# Patient Record
Sex: Female | Born: 1950 | Race: White | Hispanic: No | Smoking: Current every day smoker
Health system: Southern US, Community
[De-identification: ages and names within clinical notes are randomized; demographics above are authoritative.]

## PROBLEM LIST (undated history)

## (undated) DIAGNOSIS — J45909 Unspecified asthma, uncomplicated: Secondary | ICD-10-CM

## (undated) DIAGNOSIS — IMO0001 Reserved for inherently not codable concepts without codable children: Secondary | ICD-10-CM

## (undated) DIAGNOSIS — H9190 Unspecified hearing loss, unspecified ear: Secondary | ICD-10-CM

## (undated) DIAGNOSIS — J40 Bronchitis, not specified as acute or chronic: Secondary | ICD-10-CM

## (undated) HISTORY — DX: Reserved for inherently not codable concepts without codable children: IMO0001

## (undated) HISTORY — PX: CARTILAGE SURGERY: SHX1303

## (undated) HISTORY — DX: Unspecified asthma, uncomplicated: J45.909

## (undated) HISTORY — DX: Bronchitis, not specified as acute or chronic: J40

## (undated) HISTORY — PX: INNER EAR SURGERY: SHX679

## (undated) HISTORY — PX: APPENDECTOMY: SHX54

## (undated) HISTORY — PX: TUBAL LIGATION: SHX77

## (undated) HISTORY — DX: Unspecified hearing loss, unspecified ear: H91.90

---

## 2001-10-04 DIAGNOSIS — N200 Calculus of kidney: Secondary | ICD-10-CM | POA: Insufficient documentation

## 2005-02-09 ENCOUNTER — Ambulatory Visit: Payer: Self-pay | Admitting: Family Medicine

## 2006-01-13 ENCOUNTER — Ambulatory Visit: Payer: Self-pay | Admitting: Family Medicine

## 2006-01-27 ENCOUNTER — Ambulatory Visit: Payer: Self-pay | Admitting: Gastroenterology

## 2006-02-04 ENCOUNTER — Ambulatory Visit (HOSPITAL_COMMUNITY): Admission: RE | Admit: 2006-02-04 | Discharge: 2006-02-04 | Payer: Self-pay | Admitting: Gastroenterology

## 2006-02-04 DIAGNOSIS — K222 Esophageal obstruction: Secondary | ICD-10-CM | POA: Insufficient documentation

## 2006-02-04 DIAGNOSIS — K449 Diaphragmatic hernia without obstruction or gangrene: Secondary | ICD-10-CM | POA: Insufficient documentation

## 2006-02-11 ENCOUNTER — Ambulatory Visit: Payer: Self-pay | Admitting: Gastroenterology

## 2006-11-03 ENCOUNTER — Ambulatory Visit: Payer: Self-pay | Admitting: Family Medicine

## 2008-06-25 ENCOUNTER — Ambulatory Visit: Payer: Self-pay | Admitting: Family Medicine

## 2008-06-25 DIAGNOSIS — H6120 Impacted cerumen, unspecified ear: Secondary | ICD-10-CM | POA: Insufficient documentation

## 2008-06-28 DIAGNOSIS — H9190 Unspecified hearing loss, unspecified ear: Secondary | ICD-10-CM | POA: Insufficient documentation

## 2008-07-19 ENCOUNTER — Telehealth (INDEPENDENT_AMBULATORY_CARE_PROVIDER_SITE_OTHER): Payer: Self-pay | Admitting: Internal Medicine

## 2008-11-15 ENCOUNTER — Emergency Department (HOSPITAL_COMMUNITY): Admission: EM | Admit: 2008-11-15 | Discharge: 2008-11-15 | Payer: Self-pay | Admitting: Emergency Medicine

## 2011-04-11 ENCOUNTER — Emergency Department: Payer: Self-pay | Admitting: Emergency Medicine

## 2014-02-26 ENCOUNTER — Emergency Department: Payer: Self-pay | Admitting: Emergency Medicine

## 2014-02-26 LAB — COMPREHENSIVE METABOLIC PANEL
ALBUMIN: 4.3 g/dL (ref 3.4–5.0)
Alkaline Phosphatase: 68 U/L
Anion Gap: 7 (ref 7–16)
BUN: 26 mg/dL — AB (ref 7–18)
Bilirubin,Total: 0.2 mg/dL (ref 0.2–1.0)
CHLORIDE: 111 mmol/L — AB (ref 98–107)
CREATININE: 0.56 mg/dL — AB (ref 0.60–1.30)
Calcium, Total: 9.4 mg/dL (ref 8.5–10.1)
Co2: 22 mmol/L (ref 21–32)
EGFR (Non-African Amer.): >60
GLUCOSE: 86 mg/dL (ref 65–99)
OSMOLALITY: 283 (ref 275–301)
Potassium: 3.8 mmol/L (ref 3.5–5.1)
SGOT(AST): 28 U/L (ref 15–37)
SGPT (ALT): 13 U/L (ref 12–78)
Sodium: 140 mmol/L (ref 136–145)
Total Protein: 8.3 g/dL — ABNORMAL HIGH (ref 6.4–8.2)

## 2014-02-26 LAB — URINALYSIS, COMPLETE
Bilirubin,UR: NEGATIVE
Blood: NEGATIVE
GLUCOSE, UR: NEGATIVE mg/dL (ref 0–75)
Nitrite: NEGATIVE
Ph: 5 (ref 4.5–8.0)
Protein: 30
RBC,UR: 10 /HPF (ref 0–5)
Specific Gravity: 1.032 (ref 1.003–1.030)

## 2014-02-26 LAB — CBC WITH DIFFERENTIAL/PLATELET
BASOS ABS: 0.1 10*3/uL (ref 0.0–0.1)
Basophil %: 1.1 %
Eosinophil #: 0 10*3/uL (ref 0.0–0.7)
Eosinophil %: 0.3 %
HCT: 43.5 % (ref 35.0–47.0)
HGB: 14.3 g/dL (ref 12.0–16.0)
LYMPHS ABS: 0.9 10*3/uL — AB (ref 1.0–3.6)
Lymphocyte %: 15.6 %
MCH: 33 pg (ref 26.0–34.0)
MCHC: 32.9 g/dL (ref 32.0–36.0)
MCV: 100 fL (ref 80–100)
MONO ABS: 0.4 x10 3/mm (ref 0.2–0.9)
Monocyte %: 7 %
NEUTROS ABS: 4.5 10*3/uL (ref 1.4–6.5)
NEUTROS PCT: 76 %
Platelet: 414 10*3/uL (ref 150–440)
RBC: 4.34 10*6/uL (ref 3.80–5.20)
RDW: 13.8 % (ref 11.5–14.5)
WBC: 6 10*3/uL (ref 3.6–11.0)

## 2015-10-09 IMAGING — CR NECK SOFT TISSUES - 1+ VIEW
1 series · 2 of 2 positions shown · non-contrast
Comparison: None.

CLINICAL DATA: Vomiting, sensation of foreign body

EXAM:
NECK SOFT TISSUES - 1+ VIEW

[Series 1: w soft tissue neck lat · 0.14mm/px · 2 of 2 slices shown]
[im 1/2]
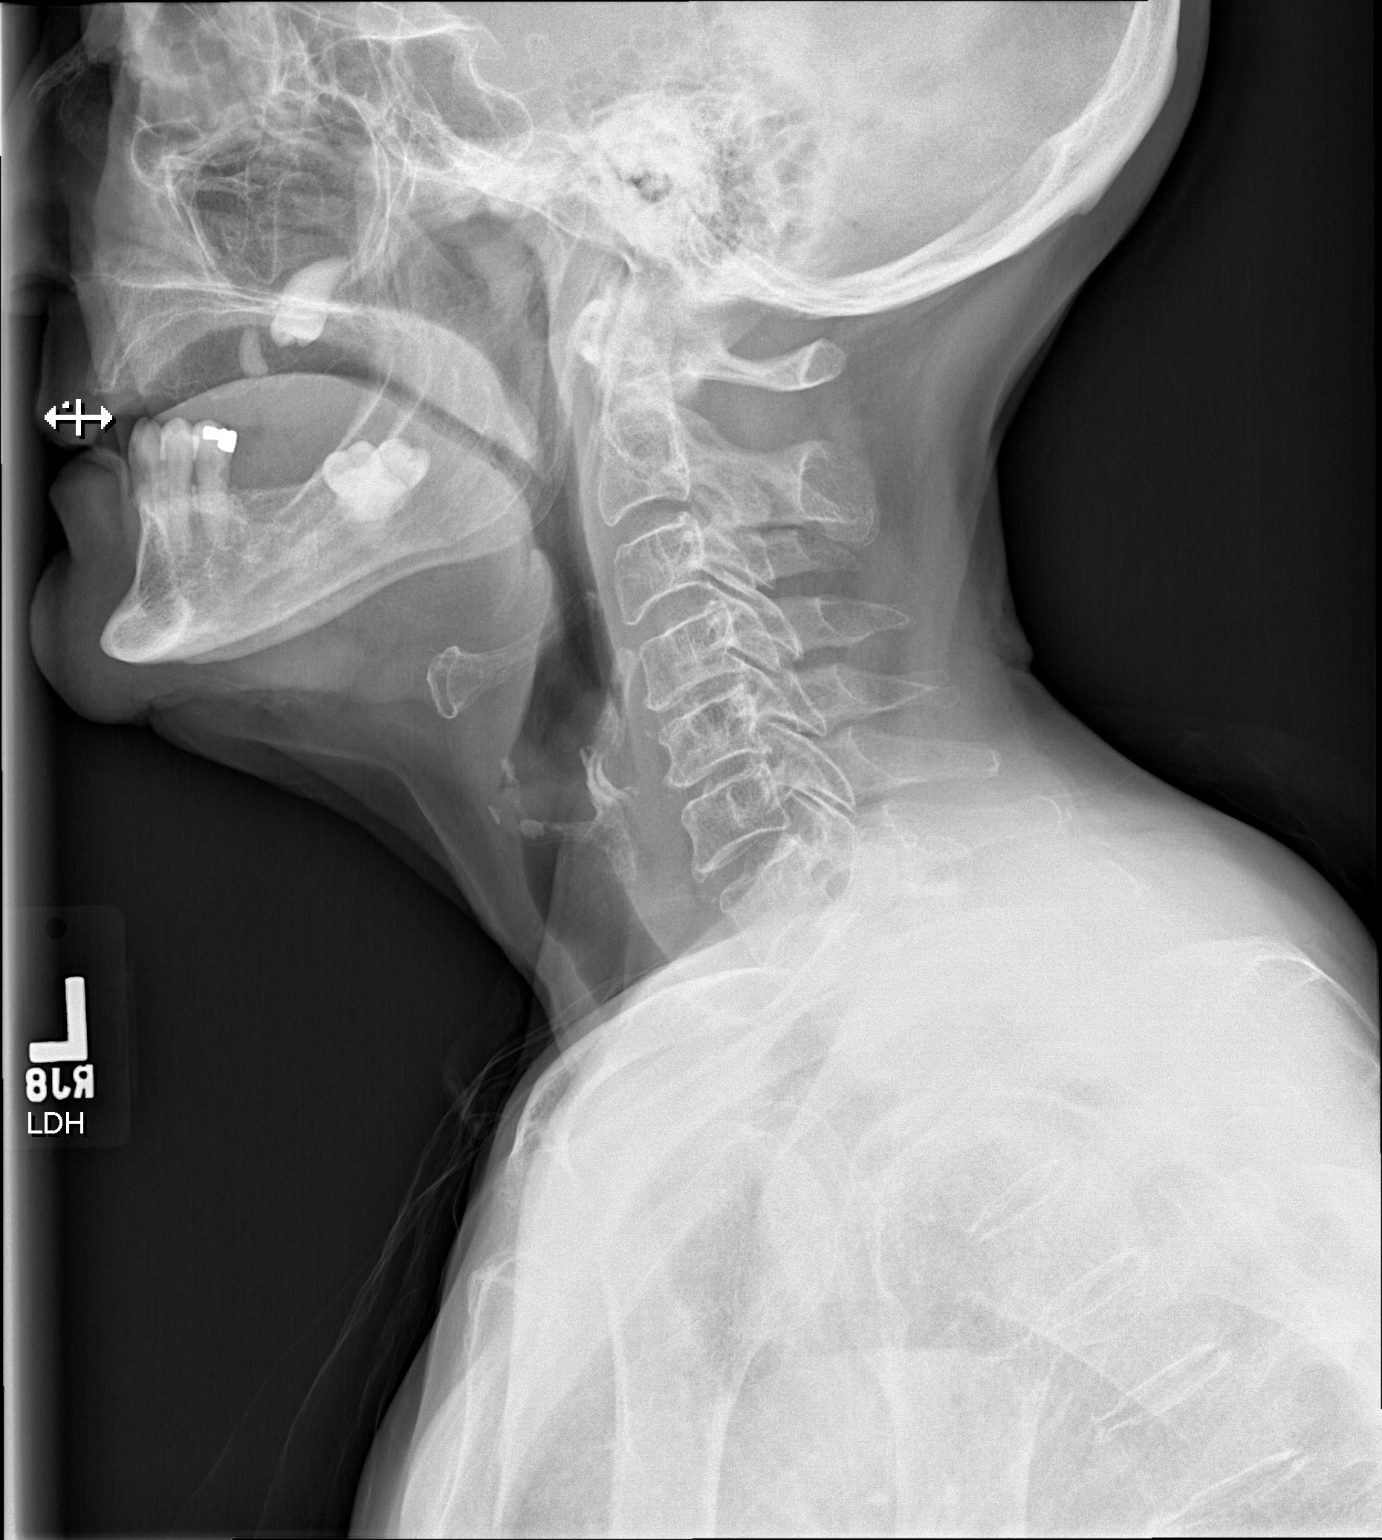
[im 2/2]
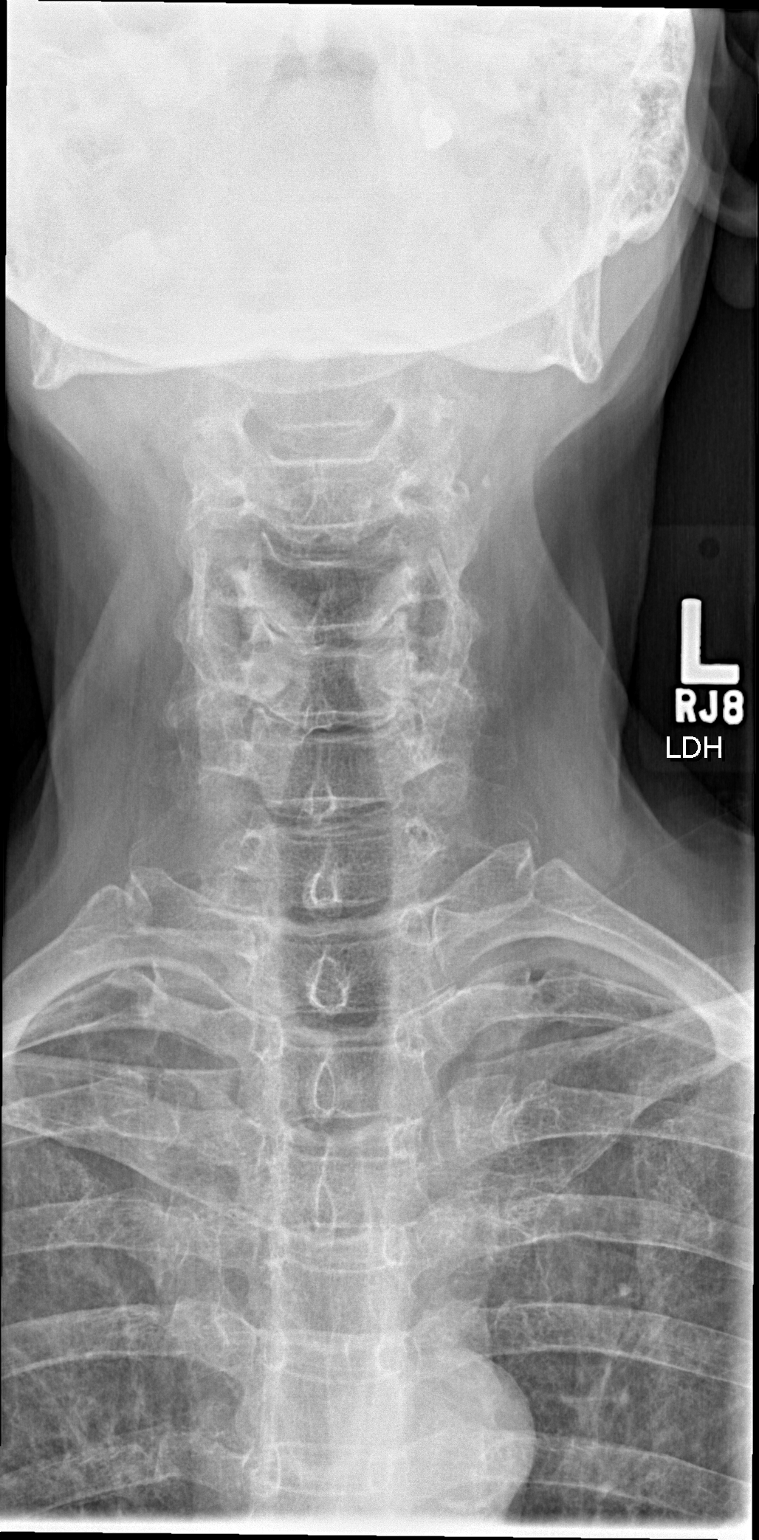

[2 of 2 positions shown; findings below may reference images not displayed]

FINDINGS: Mild degenerative changes of cervical spine are noted. No radiopaque
foreign body is seen. No gross soft tissue abnormality is noted.
IMPRESSION: No acute abnormality noted.

## 2022-04-28 ENCOUNTER — Ambulatory Visit: Payer: Self-pay | Admitting: Family

## 2022-05-05 ENCOUNTER — Encounter: Payer: Self-pay | Admitting: Family

## 2022-05-05 ENCOUNTER — Telehealth: Payer: Self-pay | Admitting: Family

## 2022-05-05 ENCOUNTER — Other Ambulatory Visit: Payer: Self-pay | Admitting: Family

## 2022-05-05 ENCOUNTER — Ambulatory Visit (INDEPENDENT_AMBULATORY_CARE_PROVIDER_SITE_OTHER): Payer: Self-pay | Admitting: Family

## 2022-05-05 ENCOUNTER — Ambulatory Visit (INDEPENDENT_AMBULATORY_CARE_PROVIDER_SITE_OTHER)
Admission: RE | Admit: 2022-05-05 | Discharge: 2022-05-05 | Disposition: A | Discharge status: Home / Self Care | Payer: Self-pay | Source: Ambulatory Visit | Attending: Family | Admitting: Family

## 2022-05-05 VITALS — BP 130/90 | HR 100 | Temp 98.6°F | Resp 16 | Ht 61.5 in | Wt 116.2 lb

## 2022-05-05 DIAGNOSIS — Z720 Tobacco use: Secondary | ICD-10-CM

## 2022-05-05 DIAGNOSIS — R9389 Abnormal findings on diagnostic imaging of other specified body structures: Secondary | ICD-10-CM | POA: Insufficient documentation

## 2022-05-05 DIAGNOSIS — J42 Unspecified chronic bronchitis: Secondary | ICD-10-CM

## 2022-05-05 DIAGNOSIS — R5383 Other fatigue: Secondary | ICD-10-CM

## 2022-05-05 DIAGNOSIS — R0602 Shortness of breath: Secondary | ICD-10-CM

## 2022-05-05 DIAGNOSIS — H3530 Unspecified macular degeneration: Secondary | ICD-10-CM

## 2022-05-05 DIAGNOSIS — J411 Mucopurulent chronic bronchitis: Secondary | ICD-10-CM

## 2022-05-05 DIAGNOSIS — H259 Unspecified age-related cataract: Secondary | ICD-10-CM

## 2022-05-05 DIAGNOSIS — Z13220 Encounter for screening for lipoid disorders: Secondary | ICD-10-CM | POA: Insufficient documentation

## 2022-05-05 LAB — COMPREHENSIVE METABOLIC PANEL
ALT: 6 U/L (ref 0–35)
AST: 14 U/L (ref 0–37)
Albumin: 4.5 g/dL (ref 3.5–5.2)
Alkaline Phosphatase: 59 U/L (ref 39–117)
BUN: 13 mg/dL (ref 6–23)
CO2: 30 mEq/L (ref 19–32)
Calcium: 9.5 mg/dL (ref 8.4–10.5)
Chloride: 99 mEq/L (ref 96–112)
Creatinine, Ser: 0.59 mg/dL (ref 0.40–1.20)
GFR: 91.89 mL/min (ref >60.00)
Glucose, Bld: 89 mg/dL (ref 70–99)
Potassium: 4.7 mEq/L (ref 3.5–5.1)
Sodium: 136 mEq/L (ref 135–145)
Total Bilirubin: 0.4 mg/dL (ref 0.2–1.2)
Total Protein: 7.4 g/dL (ref 6.0–8.3)

## 2022-05-05 LAB — CBC WITH DIFFERENTIAL/PLATELET
Basophils Absolute: 0.1 10*3/uL (ref 0.0–0.1)
Basophils Relative: 1.1 % (ref 0.0–3.0)
Eosinophils Absolute: 0.1 10*3/uL (ref 0.0–0.7)
Eosinophils Relative: 1.2 % (ref 0.0–5.0)
HCT: 43.7 % (ref 36.0–46.0)
Hemoglobin: 14.3 g/dL (ref 12.0–15.0)
Lymphocytes Relative: 10.1 % — ABNORMAL LOW (ref 12.0–46.0)
Lymphs Abs: 0.8 10*3/uL (ref 0.7–4.0)
MCHC: 32.7 g/dL (ref 30.0–36.0)
MCV: 94.8 fl (ref 78.0–100.0)
Monocytes Absolute: 0.4 10*3/uL (ref 0.1–1.0)
Monocytes Relative: 4.9 % (ref 3.0–12.0)
Neutro Abs: 6.3 10*3/uL (ref 1.4–7.7)
Neutrophils Relative %: 82.7 % — ABNORMAL HIGH (ref 43.0–77.0)
Platelets: 369 10*3/uL (ref 150.0–400.0)
RBC: 4.61 Mil/uL (ref 3.87–5.11)
RDW: 13.3 % (ref 11.5–15.5)
WBC: 7.7 10*3/uL (ref 4.0–10.5)

## 2022-05-05 LAB — LIPID PANEL
Cholesterol: 166 mg/dL (ref 0–200)
HDL: 47.5 mg/dL (ref >39.00)
LDL Cholesterol: 96 mg/dL (ref 0–99)
NonHDL: 118.05
Total CHOL/HDL Ratio: 3
Triglycerides: 108 mg/dL (ref 0.0–149.0)
VLDL: 21.6 mg/dL (ref 0.0–40.0)

## 2022-05-05 MED ORDER — ANORO ELLIPTA 62.5-25 MCG/ACT IN AEPB: 1.0000 | INHALATION_SPRAY | Freq: Every day | RESPIRATORY_TRACT | 2 refills | Status: DC

## 2022-05-05 MED ORDER — ALBUTEROL SULFATE HFA 108 (90 BASE) MCG/ACT IN AERS: 2.0000 | INHALATION_SPRAY | Freq: Four times a day (QID) | RESPIRATORY_TRACT | 0 refills | Status: DC | PRN

## 2022-05-05 MED ORDER — PREDNISONE 20 MG PO TABS: ORAL_TABLET | ORAL | 0 refills | Status: DC

## 2022-05-05 MED ORDER — DOXYCYCLINE HYCLATE 100 MG PO TABS: 100.0000 mg | ORAL_TABLET | Freq: Two times a day (BID) | ORAL | 0 refills | Status: AC

## 2022-05-05 NOTE — Assessment & Plan Note (Signed)
Ordered lipid panel, pending results. Work on low cholesterol diet and exercise as tolerated ? ?

## 2022-05-05 NOTE — Telephone Encounter (Signed)
Patient filled out a Disabilitity parking placard form and it was placed in Tabitha's box. Thank you!

## 2022-05-05 NOTE — Assessment & Plan Note (Signed)
Cbc cmp today  Also pt sick so assessing further with cxr

## 2022-05-05 NOTE — Telephone Encounter (Signed)
Patient contacted our office re: medication submitted to [facility] today  She was able to get three of the scripts, but the   umeclidinium-vilanterol (ANORO ELLIPTA) 62.5-25 MCG/ACT AEPB  Was a too expensive, Pharmacist did not recommend an alternate  Please follow-up with the patient

## 2022-05-05 NOTE — Telephone Encounter (Signed)
Placed in [place] box in her office.

## 2022-05-05 NOTE — Assessment & Plan Note (Signed)
amb ref lung screening  Discussed cessation

## 2022-05-05 NOTE — Progress Notes (Signed)
New Patient Office Visit  Subjective:  Patient ID: Natalie Haynes, female    DOB: September 24, 1951  Age: 71 y.o. MRN: [id number]  CC:  Chief Complaint  Patient presents with   Establish Care    HPI Natalie Haynes is here to establish care as a new patient.  Prior provider was: has not seen a provider in a while.  Pt is without acute concerns.   Not been feeling well for about three weeks.  Chest congestion, wheezing, with sob and cough that is productive with yellow sputum.  Pt very fatigued. No fever. No chills.  Nasal congestion and constant rhinorrhea. No sore throat but more from coughing. No ear pain.   Was taking mucinex didn't help  Doing otc primitine mist which isn't really helping.    Past Medical History:  Diagnosis Date   Bronchitis    Hearing impaired     Past Surgical History:  Procedure Laterality Date   APPENDECTOMY     CARTILAGE SURGERY     Removed from nose   INNER EAR SURGERY     TUBAL LIGATION      Family History  Problem Relation Age of Onset   Miscarriages / Stillbirths Mother    Hearing loss Mother    Drug abuse Mother    Depression Mother    Alcohol abuse Mother    Brain cancer Mother    Mental illness Mother    Early death Father        killed in [place]   Hearing loss Paternal Grandmother    Stroke Paternal Grandmother     Social History   Socioeconomic History   Marital status: Married    Spouse name: Not on file   Number of children: Not on file   Years of education: Not on file   Highest education level: Not on file  Occupational History   Occupation: retired  Tobacco Use   Smoking status: Former    Packs/day: 1.50    Years: 40.00    Total pack years: 60.00    Types: Cigarettes   Smokeless tobacco: Never  Vaping Use   Vaping Use: Never used  Substance and Sexual Activity   Alcohol use: Not Currently   Drug use: Not Currently    Types: Marijuana, LSD    Comment: in the 70's not since   Sexual activity: Yes     Partners: Male    Birth control/protection: Surgical  Other Topics Concern   Not on file  Social History Narrative   Not on file   Social Determinants of Health   Financial Resource Strain: Not on file  Food Insecurity: Not on file  Transportation Needs: Not on file  Physical Activity: Not on file  Stress: Not on file  Social Connections: Not on file  Intimate Partner Violence: Not on file    Outpatient Medications Prior to Visit  Medication Sig Dispense Refill   EPINEPHrine (PRIMATENE MIST IN) Inhale into the lungs.     EPINEPHrine 0.125 MG/ACT AERO Inhale into the lungs.     No facility-administered medications prior to visit.    Allergies  Allergen Reactions   Codeine     REACTION: nausea, abd cramps   Ibuprofen     REACTION: antiphylactic shock   Penicillins     REACTION: rash    ROS Review of Systems  Review of Systems  Respiratory:  Negative for shortness of breath.   Cardiovascular:  Negative for chest pain and palpitations.  Gastrointestinal:  Negative for constipation and diarrhea.  Genitourinary:  Negative for dysuria, frequency and urgency.  Musculoskeletal:  Negative for myalgias.  Psychiatric/Behavioral:  Negative for depression and suicidal ideas.   All other systems reviewed and are negative.    Objective:    Physical Exam Constitutional:      General: She is not in acute distress.    Appearance: Normal appearance. She is normal weight. She is not ill-appearing.  HENT:     Right Ear: Tympanic membrane normal.     Left Ear: Tympanic membrane normal.     Nose: Nose normal. No congestion or rhinorrhea.     Right Turbinates: Not enlarged or swollen.     Left Turbinates: Not enlarged or swollen.     Right Sinus: No maxillary sinus tenderness or frontal sinus tenderness.     Left Sinus: No maxillary sinus tenderness or frontal sinus tenderness.     Mouth/Throat:     Mouth: Mucous membranes are moist.     Pharynx: No pharyngeal swelling,  oropharyngeal exudate or posterior oropharyngeal erythema.     Tonsils: No tonsillar exudate.  Eyes:     Extraocular Movements: Extraocular movements intact.     Conjunctiva/sclera: Conjunctivae normal.     Pupils: Pupils are equal, round, and reactive to light.  Neck:     Thyroid: No thyroid mass.  Cardiovascular:     Rate and Rhythm: Regular rhythm. Tachycardia present.  Pulmonary:     Effort: Pulmonary effort is normal.     Breath sounds: Decreased air movement present. Examination of the right-upper field reveals wheezing. Examination of the left-upper field reveals wheezing. Examination of the right-middle field reveals wheezing. Examination of the left-middle field reveals wheezing. Examination of the right-lower field reveals wheezing. Examination of the left-lower field reveals wheezing. Decreased breath sounds and wheezing present. No rhonchi or rales.  Musculoskeletal:     Right lower leg: No edema.     Left lower leg: No edema.  Lymphadenopathy:     Cervical:     Right cervical: No superficial cervical adenopathy.    Left cervical: No superficial cervical adenopathy.  Neurological:     Mental Status: She is alert.       BP (!) 130/90   Pulse 100   Temp 98.6 F (37 C)   Resp 16   Ht 5' 1.5" (1.562 m)   Wt 116 lb 4 oz (52.7 kg)   SpO2 93%   BMI 21.61 kg/m  Wt Readings from Last 3 Encounters:  05/05/22 116 lb 4 oz (52.7 kg)     Health Maintenance Due  Topic Date Due   COVID-19 Vaccine (1) Never done   Hepatitis C Screening  Never done   TETANUS/TDAP  Never done   COLONOSCOPY (Pts 45-45yrs Insurance coverage will need to be confirmed)  Never done   MAMMOGRAM  Never done   Zoster Vaccines- Shingrix (1 of 2) Never done   Pneumonia Vaccine 49+ Years old (1 - PCV) Never done   DEXA SCAN  Never done   INFLUENZA VACCINE  05/04/2022    There are no preventive care reminders to display for this patient.  No results found for: "TSH" Lab Results  Component  Value Date   WBC 6.0 02/26/2014   HGB 14.3 02/26/2014   HCT 43.5 02/26/2014   MCV 100 02/26/2014   PLT 414 02/26/2014   Lab Results  Component Value Date   NA 140 02/26/2014   K 3.8 02/26/2014   CO2 22  02/26/2014   GLUCOSE 86 02/26/2014   BUN 26 (H) 02/26/2014   CREATININE 0.56 (L) 02/26/2014   BILITOT 0.2 02/26/2014   ALKPHOS 68 02/26/2014   AST 28 02/26/2014   ALT 13 02/26/2014   PROT 8.3 (H) 02/26/2014   ALBUMIN 4.3 02/26/2014   CALCIUM 9.4 02/26/2014   ANIONGAP 7 02/26/2014   No results found for: "CHOL" No results found for: "HDL" No results found for: "LDLCALC" No results found for: "TRIG" No results found for: "CHOLHDL" No results found for: "HGBA1C"    Assessment & Plan:   Problem List Items Addressed This Visit       Respiratory   Chronic bronchitis (HCC)    rx prednisone 20 mg tapeer rx doxycycline 100 mg po bid x 10 days Gold group B  Starting on anora elipita inhaler       Relevant Medications   doxycycline (VIBRA-TABS) 100 MG tablet   predniSONE (DELTASONE) 20 MG tablet   umeclidinium-vilanterol (ANORO ELLIPTA) 62.5-25 MCG/ACT AEPB   Other Relevant Orders   DG Chest 2 View (Completed)   CBC with Differential   RESOLVED: Mucopurulent chronic bronchitis (HCC)   Relevant Medications   umeclidinium-vilanterol (ANORO ELLIPTA) 62.5-25 MCG/ACT AEPB     Other   Screening for lipoid disorders    Ordered lipid panel, pending results. Work on low cholesterol diet and exercise as tolerated        SOB (shortness of breath)    rx albuterol hfa 108 mcg        Relevant Medications   albuterol (VENTOLIN HFA) 108 (90 Base) MCG/ACT inhaler   predniSONE (DELTASONE) 20 MG tablet   umeclidinium-vilanterol (ANORO ELLIPTA) 62.5-25 MCG/ACT AEPB   Other Relevant Orders   DG Chest 2 View (Completed)   CBC with Differential   Other fatigue    Cbc cmp today  Also pt sick so assessing further with cxr       Relevant Medications   doxycycline  (VIBRA-TABS) 100 MG tablet   predniSONE (DELTASONE) 20 MG tablet   Other Relevant Orders   DG Chest 2 View (Completed)   CBC with Differential   Comprehensive metabolic panel   Lipid panel   Tobacco abuse - Primary    amb ref lung screening  Discussed cessation       Relevant Medications   doxycycline (VIBRA-TABS) 100 MG tablet   Other Relevant Orders   Ambulatory Referral Lung Cancer Screening [facility]   Macular degeneration   Relevant Orders   Ambulatory referral to Ophthalmology   Age-related cataract of both eyes   Relevant Orders   Ambulatory referral to Ophthalmology    Meds ordered this encounter  Medications   albuterol (VENTOLIN HFA) 108 (90 Base) MCG/ACT inhaler    Sig: Inhale 2 puffs into the lungs every 6 (six) hours as needed for wheezing or shortness of breath.    Dispense:  8 g    Refill:  0    Order Specific Question:   Supervising Provider    Answer:   BEDSOLE, AMY E [[id number]]   doxycycline (VIBRA-TABS) 100 MG tablet    Sig: Take 1 tablet (100 mg total) by mouth 2 (two) times daily for 10 days.    Dispense:  20 tablet    Refill:  0    Order Specific Question:   Supervising Provider    Answer:   BEDSOLE, AMY E [[id number]]   predniSONE (DELTASONE) 20 MG tablet    Sig: Take 2 tablets by mouth  once daily for 5 days.    Dispense:  10 tablet    Refill:  0    Order Specific Question:   Supervising Provider    Answer:   BEDSOLE, AMY E [[id number]]   umeclidinium-vilanterol (ANORO ELLIPTA) 62.5-25 MCG/ACT AEPB    Sig: Inhale 1 puff into the lungs daily.    Dispense:  1 each    Refill:  2    Order Specific Question:   Supervising Provider    Answer:   Ermalene Searing, AMY E [[id number]]    Follow-up: Return in about 2 weeks (around 05/19/2022) for follow up on acute concerns.    Mort Sawyers, FNP

## 2022-05-05 NOTE — Telephone Encounter (Signed)
Natalie Haynes seen X-ray results and left pt  a message to call the office back.

## 2022-05-05 NOTE — Patient Instructions (Addendum)
Complete xray(s) prior to leaving today. I will notify you of your results once received. Stop by the lab prior to leaving today. I will notify you of your results once received.   A referral was placed today for opthamology.  Please let us know if you have not heard back within 2 weeks about the referral.  Start doxycycline for antibiotic.  Albuterol HFA is for sob as needed.  You will start DAILY anoro inhaler for maintenance.  Start prednisone as directed.   Welcome to our clinic, I am happy to have you as my new patient. I am excited to continue on this healthcare journey with you.  Stop by the lab prior to leaving today. I will notify you of your results once received.   Please keep in mind Any my chart messages you send have up to a three business day turnaround for a response.  Phone calls may take up to a one full business day turnaround for a  response.   If you need a medication refill I recommend you request it through the pharmacy as this is easiest for [place] rather than sending a message and or phone call.   Due to recent changes in healthcare laws, you may see results of your imaging and/or laboratory studies on MyChart before I have had a chance to review them.  I understand that in some cases there may be results that are confusing or concerning to you. Please understand that not all results are received at the same time and often I may need to interpret multiple results in order to provide you with the best plan of care or course of treatment. Therefore, I ask that you please give me 2 business days to thoroughly review all your results before contacting my office for clarification. Should we see a critical lab result, you will be contacted sooner.   It was a pleasure seeing you today! Please do not hesitate to reach out with any questions and or concerns.  Regards,   Mort Sawyers FNP-C

## 2022-05-05 NOTE — Assessment & Plan Note (Signed)
rx albuterol hfa 108 mcg

## 2022-05-05 NOTE — Assessment & Plan Note (Addendum)
rx prednisone 20 mg tapeer rx doxycycline 100 mg po bid x 10 days Gold group B  Starting on anora elipita inhaler

## 2022-05-05 NOTE — Telephone Encounter (Signed)
Stacie at [facility] called to make sure that provider saw the results in Epic for chest x-ray.

## 2022-05-05 NOTE — Telephone Encounter (Signed)
Natalie Haynes with [facility] called to to give xray report. She said she has to give verbally, please call back at [phone]

## 2022-05-05 NOTE — Progress Notes (Signed)
Attempted to call pt to let her know of CXR results.   Please attempt to give pt a call later.  Xray consistent with chronic bronchitis, COPD as expected but there was also an area of fullness that they are unable to see clearly on xray so a CT needed to be ordered. I have ordered this,and you should be getting a call to schedule soon.

## 2022-05-06 NOTE — Progress Notes (Signed)
Cholesterol is beautiful. Labs unremarkable.  Cbc reflects pt sick.

## 2022-05-06 NOTE — Telephone Encounter (Signed)
Did see the results. Thank you.

## 2022-05-07 ENCOUNTER — Other Ambulatory Visit: Payer: Self-pay | Admitting: Family

## 2022-05-07 ENCOUNTER — Telehealth: Payer: Self-pay | Admitting: Family

## 2022-05-07 DIAGNOSIS — J42 Unspecified chronic bronchitis: Secondary | ICD-10-CM

## 2022-05-07 DIAGNOSIS — R0602 Shortness of breath: Secondary | ICD-10-CM

## 2022-05-07 MED ORDER — BEVESPI AEROSPHERE 9-4.8 MCG/ACT IN AERO: 2.0000 | INHALATION_SPRAY | Freq: Every day | RESPIRATORY_TRACT | 11 refills | Status: DC

## 2022-05-07 NOTE — Telephone Encounter (Signed)
Plse advise pt would want to take mucinex. Without DM.  Mucinex helps to thin secretions to help her cough up the mucous better.  Wouldn't want cough medication as this would suppress the cough and she would not cough it up.

## 2022-05-07 NOTE — Progress Notes (Signed)
Eve

## 2022-05-07 NOTE — Telephone Encounter (Signed)
Called and informed pt and she said she would let us know if it was to expensive.

## 2022-05-07 NOTE — Telephone Encounter (Signed)
Please advise pt that I have sent in another inhaler.  Let me know if too expensive.

## 2022-05-07 NOTE — Telephone Encounter (Signed)
Called and informed pt about which medication to get and she said she would get some Mucinex.

## 2022-05-07 NOTE — Telephone Encounter (Signed)
Pt came in office stated would like cough medicine be a liquid . Please advise

## 2022-05-07 NOTE — Telephone Encounter (Signed)
Patient called in,and wanted to know could she get some cough medicine sent in for her to be able to cough up congestion that in there chest?

## 2022-05-19 ENCOUNTER — Other Ambulatory Visit: Payer: Self-pay | Admitting: Family

## 2022-05-19 ENCOUNTER — Ambulatory Visit (INDEPENDENT_AMBULATORY_CARE_PROVIDER_SITE_OTHER): Payer: Self-pay | Admitting: Family

## 2022-05-19 ENCOUNTER — Telehealth: Payer: Self-pay | Admitting: Pharmacist

## 2022-05-19 ENCOUNTER — Encounter: Payer: Self-pay | Admitting: Family

## 2022-05-19 VITALS — BP 130/88 | HR 116 | Temp 98.6°F | Resp 16 | Ht 61.5 in | Wt 113.2 lb

## 2022-05-19 DIAGNOSIS — R0609 Other forms of dyspnea: Secondary | ICD-10-CM

## 2022-05-19 DIAGNOSIS — R0602 Shortness of breath: Secondary | ICD-10-CM

## 2022-05-19 DIAGNOSIS — R9389 Abnormal findings on diagnostic imaging of other specified body structures: Secondary | ICD-10-CM

## 2022-05-19 DIAGNOSIS — J42 Unspecified chronic bronchitis: Secondary | ICD-10-CM

## 2022-05-19 DIAGNOSIS — R5383 Other fatigue: Secondary | ICD-10-CM

## 2022-05-19 DIAGNOSIS — R634 Abnormal weight loss: Secondary | ICD-10-CM

## 2022-05-19 DIAGNOSIS — R000 Tachycardia, unspecified: Secondary | ICD-10-CM

## 2022-05-19 LAB — CBC WITH DIFFERENTIAL/PLATELET
Basophils Absolute: 0.1 10*3/uL (ref 0.0–0.1)
Basophils Relative: 1.1 % (ref 0.0–3.0)
Eosinophils Absolute: 0.2 10*3/uL (ref 0.0–0.7)
Eosinophils Relative: 2.4 % (ref 0.0–5.0)
HCT: 43.5 % (ref 36.0–46.0)
Hemoglobin: 14.3 g/dL (ref 12.0–15.0)
Lymphocytes Relative: 12.7 % (ref 12.0–46.0)
Lymphs Abs: 1 10*3/uL (ref 0.7–4.0)
MCHC: 32.9 g/dL (ref 30.0–36.0)
MCV: 94.9 fl (ref 78.0–100.0)
Monocytes Absolute: 0.4 10*3/uL (ref 0.1–1.0)
Monocytes Relative: 5.7 % (ref 3.0–12.0)
Neutro Abs: 6 10*3/uL (ref 1.4–7.7)
Neutrophils Relative %: 78.1 % — ABNORMAL HIGH (ref 43.0–77.0)
Platelets: 388 10*3/uL (ref 150.0–400.0)
RBC: 4.59 Mil/uL (ref 3.87–5.11)
RDW: 13.3 % (ref 11.5–15.5)
WBC: 7.7 10*3/uL (ref 4.0–10.5)

## 2022-05-19 LAB — TSH: TSH: 1.32 u[IU]/mL (ref 0.35–5.50)

## 2022-05-19 LAB — VITAMIN B12: Vitamin B-12: 106 pg/mL — ABNORMAL LOW (ref 211–911)

## 2022-05-19 LAB — BRAIN NATRIURETIC PEPTIDE: Pro B Natriuretic peptide (BNP): 16 pg/mL (ref 0.0–100.0)

## 2022-05-19 MED ORDER — PREDNISONE 20 MG PO TABS: ORAL_TABLET | ORAL | 0 refills | Status: DC

## 2022-05-19 MED ORDER — BEVESPI AEROSPHERE 9-4.8 MCG/ACT IN AERO: 2.0000 | INHALATION_SPRAY | Freq: Every day | RESPIRATORY_TRACT | 3 refills | Status: DC

## 2022-05-19 NOTE — Progress Notes (Signed)
Kelly please let pt know the following:  Vitamin B12 very low, please set up for B12 injections, 1000 mcg IM once monthly for three months, also schedule 3 month f/u appt to repeat labs and f/u in office. Recommend also oral otc 1000 mcg once daily vitamin B12  Tresa Endo, Just documentation: no need to relay below   Ekg reviewed with pt in office.   Neutrophils still slightly elevated. We are awaiting results of CT chest as well.  Starting on inhaler and also more prednisone to help with COPD exacerbation.  Thyroid ok.

## 2022-05-19 NOTE — Patient Instructions (Signed)
Stop by the lab prior to leaving today. I will notify you of your results once received.   Due to recent changes in healthcare laws, you may see results of your imaging and/or laboratory studies on MyChart before I have had a chance to review them.  I understand that in some cases there may be results that are confusing or concerning to you. Please understand that not all results are received at the same time and often I may need to interpret multiple results in order to provide you with the best plan of care or course of treatment. Therefore, I ask that you please give me 2 business days to thoroughly review all your results before contacting my office for clarification. Should we see a critical lab result, you will be contacted sooner.   It was a pleasure seeing you today! Please do not hesitate to reach out with any questions and or concerns.  Regards,   Brodi Kari FNP-C  

## 2022-05-19 NOTE — Progress Notes (Signed)
Established Patient Office Visit  Subjective:  Patient ID: Natalie Haynes, female    DOB: [birth year]  Age: 71 y.o. MRN: [id number]  CC:  Chief Complaint  Patient presents with  . Shortness of Breath  . Insomnia    HPI Natalie Haynes is here today for follow up.   Bronchitis: completed doxycycline and prednisone with only mild relief. Still feels chunks of phlegm when lying down. Using incentive spirometry to try to move it around but it is very hard for her.  CXR showed soft tissue fullness in right paratracheal area, Ct chest ordered , scheduled for August 26th.   Is losing weight, has lost three pounds in two weeks. Was around 130 pounds has lost weight over the last five years without even trying. Now 113 pounds.   Is finding she has to use her albuterol inhaler every six hours for sob.   Heart rate today is 116 bpm.   Lab Results  Component Value Date   WBC 7.7 05/05/2022   HGB 14.3 05/05/2022   HCT 43.7 05/05/2022   MCV 94.8 05/05/2022   PLT 369.0 05/05/2022      Past Medical History:  Diagnosis Date  . Bronchitis   . Hearing impaired     Past Surgical History:  Procedure Laterality Date  . APPENDECTOMY    . CARTILAGE SURGERY     Removed from nose  . INNER EAR SURGERY    . TUBAL LIGATION      Family History  Problem Relation Age of Onset  . Miscarriages / [place] Mother   . Hearing loss Mother   . Drug abuse Mother   . Depression Mother   . Alcohol abuse Mother   . Brain cancer Mother   . Mental illness Mother   . Early death Father        killed in [place]  . Hearing loss Paternal Grandmother   . Stroke Paternal Grandmother     Social History   Socioeconomic History  . Marital status: Married    Spouse name: Not on file  . Number of children: Not on file  . Years of education: Not on file  . Highest education level: Not on file  Occupational History  . Occupation: retired  Tobacco Use  . Smoking status: Former    Packs/day: 1.50     Years: 40.00    Total pack years: 60.00    Types: Cigarettes  . Smokeless tobacco: Never  Vaping Use  . Vaping Use: Never used  Substance and Sexual Activity  . Alcohol use: Not Currently  . Drug use: Not Currently    Types: Marijuana, LSD    Comment: in the 70's not since  . Sexual activity: Yes    Partners: Male    Birth control/protection: Surgical  Other Topics Concern  . Not on file  Social History Narrative  . Not on file   Social Determinants of Health   Financial Resource Strain: Not on file  Food Insecurity: Not on file  Transportation Needs: Not on file  Physical Activity: Not on file  Stress: Not on file  Social Connections: Not on file  Intimate Partner Violence: Not on file    Outpatient Medications Prior to Visit  Medication Sig Dispense Refill  . albuterol (VENTOLIN HFA) 108 (90 Base) MCG/ACT inhaler Inhale 2 puffs into the lungs every 6 (six) hours as needed for wheezing or shortness of breath. 8 g 0  . Glycopyrrolate-Formoterol (BEVESPI AEROSPHERE) 9-4.8 MCG/ACT AERO Inhale  2 puffs into the lungs daily. (Patient not taking: Reported on 05/19/2022) 10.7 g 11  . predniSONE (DELTASONE) 20 MG tablet Take 2 tablets by mouth once daily for 5 days. (Patient not taking: Reported on 05/19/2022) 10 tablet 0   No facility-administered medications prior to visit.    Allergies  Allergen Reactions  . Codeine     REACTION: nausea, abd cramps  . Ibuprofen     REACTION: antiphylactic shock  . Penicillins     REACTION: rash          Objective:    Physical Exam Constitutional:      General: She is not in acute distress.    Appearance: She is well-developed and normal weight. She is not ill-appearing, toxic-appearing or diaphoretic.  Cardiovascular:     Rate and Rhythm: Tachycardia present. Rhythm irregularly irregular.     Pulses: Normal pulses.  Pulmonary:     Breath sounds: Decreased breath sounds present.  Musculoskeletal:     Right lower leg: No  edema.     Left lower leg: No edema.  Neurological:     Mental Status: She is alert.     BP 130/88   Pulse (!) 116   Temp 98.6 F (37 C)   Resp 16   Ht 5' 1.5" (1.562 m)   Wt 113 lb 4 oz (51.4 kg)   SpO2 92%   BMI 21.05 kg/m  Wt Readings from Last 3 Encounters:  05/19/22 113 lb 4 oz (51.4 kg)  05/05/22 116 lb 4 oz (52.7 kg)     Health Maintenance Due  Topic Date Due  . COVID-19 Vaccine (1) Never done  . Hepatitis C Screening  Never done  . TETANUS/TDAP  Never done  . COLONOSCOPY (Pts 45-19yrs Insurance coverage will need to be confirmed)  Never done  . MAMMOGRAM  Never done  . Zoster Vaccines- Shingrix (1 of 2) Never done  . Pneumonia Vaccine 79+ Years old (1 - PCV) Never done  . DEXA SCAN  Never done  . INFLUENZA VACCINE  05/04/2022    There are no preventive care reminders to display for this patient.  No results found for: "TSH" Lab Results  Component Value Date   WBC 7.7 05/05/2022   HGB 14.3 05/05/2022   HCT 43.7 05/05/2022   MCV 94.8 05/05/2022   PLT 369.0 05/05/2022   Lab Results  Component Value Date   NA 136 05/05/2022   K 4.7 05/05/2022   CO2 30 05/05/2022   GLUCOSE 89 05/05/2022   BUN 13 05/05/2022   CREATININE 0.59 05/05/2022   BILITOT 0.4 05/05/2022   ALKPHOS 59 05/05/2022   AST 14 05/05/2022   ALT 6 05/05/2022   PROT 7.4 05/05/2022   ALBUMIN 4.5 05/05/2022   CALCIUM 9.5 05/05/2022   ANIONGAP 7 02/26/2014   GFR 91.89 05/05/2022   Lab Results  Component Value Date   CHOL 166 05/05/2022   Lab Results  Component Value Date   HDL 47.50 05/05/2022   Lab Results  Component Value Date   LDLCALC 96 05/05/2022   Lab Results  Component Value Date   TRIG 108.0 05/05/2022   Lab Results  Component Value Date   CHOLHDL 3 05/05/2022   No results found for: "HGBA1C"    Assessment & Plan:   Problem List Items Addressed This Visit   None   No orders of the defined types were placed in this encounter.   Follow-up: No  follow-ups on file.  Eugenia Pancoast, FNP

## 2022-05-19 NOTE — Telephone Encounter (Signed)
Patient has no prescription insurance and reports cost for Eyvonne Left is cost prohibitive at this time.  Review[place]. Patient meets income/out of pocket spend criteria for the program.  Enrolled patient in program online successfully 05/19/22 - 10/03/22. They require new Rx sent to [facility] pharmacy, refilled Rx.  Patient can expect shipment in 10-14 business days.

## 2022-05-20 ENCOUNTER — Telehealth: Payer: Self-pay | Admitting: Family

## 2022-05-20 DIAGNOSIS — R634 Abnormal weight loss: Secondary | ICD-10-CM | POA: Insufficient documentation

## 2022-05-20 DIAGNOSIS — R000 Tachycardia, unspecified: Secondary | ICD-10-CM | POA: Insufficient documentation

## 2022-05-20 DIAGNOSIS — R0609 Other forms of dyspnea: Secondary | ICD-10-CM | POA: Insufficient documentation

## 2022-05-20 MED ORDER — PREDNISONE 20 MG PO TABS: ORAL_TABLET | ORAL | 0 refills | Status: DC

## 2022-05-20 NOTE — Assessment & Plan Note (Signed)
Likely r/t overuse of inhaler.  Monitor heart rate, let me know if stays steady above 110  If any chest pain palp increasing sob immediately call 911 or go to er

## 2022-05-20 NOTE — Assessment & Plan Note (Signed)
Another dose of prednisone sent to pharmacy.

## 2022-05-20 NOTE — Assessment & Plan Note (Signed)
Ordering ct chest pt to arrive as scheduled.  Concern for lung mass.

## 2022-05-20 NOTE — Assessment & Plan Note (Signed)
Able to get bevespi approved by pharmacist on site.  Advised pt  Pt to start once received via female Goal is to improve copd exacerbation

## 2022-05-20 NOTE — Telephone Encounter (Signed)
Resent Rx with corrected amount (#11), per sig.

## 2022-05-20 NOTE — Telephone Encounter (Signed)
Pharmacy called in asking about rx for. States that only one tablet was prescribed for this patient ,when she is ordered to take :Take two tablets po qd for four days and then one tablet po qd for three days.

## 2022-05-21 ENCOUNTER — Telehealth: Payer: Self-pay | Admitting: Family

## 2022-05-21 NOTE — Telephone Encounter (Signed)
Patient called in with questions regarding the dosage she need to take for the OTC vitamin b-12 that she need to purchase.

## 2022-05-24 NOTE — Telephone Encounter (Signed)
Called and spoke to pt and informed her to 1000 mcg B-12 OTC

## 2022-05-26 ENCOUNTER — Ambulatory Visit (INDEPENDENT_AMBULATORY_CARE_PROVIDER_SITE_OTHER): Payer: Self-pay

## 2022-05-26 DIAGNOSIS — E538 Deficiency of other specified B group vitamins: Secondary | ICD-10-CM

## 2022-05-26 MED ORDER — CYANOCOBALAMIN 1000 MCG/ML IJ SOLN
1000.0000 ug | Freq: Once | INTRAMUSCULAR | Status: AC
  Administered 2022-05-26: 1000 ug via INTRAMUSCULAR

## 2022-05-26 NOTE — Progress Notes (Signed)
Patient presented for B 12 injection given by Ailed Defibaugh, CMA to left deltoid, patient voiced no concerns nor showed any signs of distress during injection.  

## 2022-05-27 ENCOUNTER — Other Ambulatory Visit: Payer: Self-pay | Admitting: Family

## 2022-05-27 DIAGNOSIS — R0602 Shortness of breath: Secondary | ICD-10-CM

## 2022-06-03 ENCOUNTER — Other Ambulatory Visit: Payer: Self-pay

## 2022-06-10 ENCOUNTER — Telehealth: Payer: Self-pay | Admitting: Family

## 2022-06-10 NOTE — Telephone Encounter (Signed)
Patient called in stating she wasn't able to get her CT scan as they was needing insurance information. She stated that she should almost be done with her medicaid as she sent in the last few things this morning. Thank you!

## 2022-06-11 NOTE — Telephone Encounter (Signed)
I am confused by this message Is this just an FYI or do I need to do something about this?

## 2022-06-11 NOTE — Telephone Encounter (Signed)
Just FYI.

## 2022-06-15 NOTE — Telephone Encounter (Signed)
Per notes in referral/order  There is no active insurance on file other than Medicare A Pt will have to pay out of pocket. Medicare A only covers hospital, does not cover outpatient/Medical.   Pt cancelled her appt  She can have CT scan done if she has insurance to cover it or she can pay out of pocket.

## 2022-06-16 NOTE — Telephone Encounter (Signed)
Left message to return call to our office.  

## 2022-06-16 NOTE — Telephone Encounter (Signed)
Was pt notified of this info? If not can we make sure pt is informed.

## 2022-06-29 ENCOUNTER — Ambulatory Visit: Payer: Self-pay

## 2022-06-29 ENCOUNTER — Ambulatory Visit (INDEPENDENT_AMBULATORY_CARE_PROVIDER_SITE_OTHER): Payer: Self-pay

## 2022-06-29 DIAGNOSIS — E538 Deficiency of other specified B group vitamins: Secondary | ICD-10-CM

## 2022-06-29 MED ORDER — CYANOCOBALAMIN 1000 MCG/ML IJ SOLN
1000.0000 ug | Freq: Once | INTRAMUSCULAR | Status: AC
  Administered 2022-06-29: 1000 ug via INTRAMUSCULAR

## 2022-06-29 NOTE — Progress Notes (Signed)
Per orders of Tabitha Dugal FNP, injection of Vitamin B 12 given in right deltoid given by Tahje Borawski. Patient tolerated injection well.  

## 2022-06-30 ENCOUNTER — Other Ambulatory Visit: Payer: Self-pay | Admitting: Family

## 2022-06-30 DIAGNOSIS — R0602 Shortness of breath: Secondary | ICD-10-CM

## 2022-07-01 ENCOUNTER — Ambulatory Visit: Payer: Self-pay

## 2022-07-02 ENCOUNTER — Telehealth: Payer: Self-pay | Admitting: Family

## 2022-07-02 NOTE — Telephone Encounter (Signed)
Left message to return call to our office.  

## 2022-07-02 NOTE — Telephone Encounter (Signed)
Patient called and stated that her B12 shot is booked out until Nov 29 and she get the shot every month and wanted to know if that was okay and her last shot was 07/01/2022. Also she wanted to let PCP know she has been approved for Medicaid. Call back number [phone].

## 2022-07-06 ENCOUNTER — Other Ambulatory Visit: Payer: Self-pay

## 2022-07-06 DIAGNOSIS — R0602 Shortness of breath: Secondary | ICD-10-CM

## 2022-07-06 DIAGNOSIS — J42 Unspecified chronic bronchitis: Secondary | ICD-10-CM

## 2022-07-06 MED ORDER — ALBUTEROL SULFATE HFA 108 (90 BASE) MCG/ACT IN AERS: INHALATION_SPRAY | RESPIRATORY_TRACT | 0 refills | Status: DC

## 2022-07-06 MED ORDER — BEVESPI AEROSPHERE 9-4.8 MCG/ACT IN AERO: 2.0000 | INHALATION_SPRAY | Freq: Every day | RESPIRATORY_TRACT | 3 refills | Status: DC

## 2022-07-06 NOTE — Telephone Encounter (Signed)
Spoke to pt about this matter.

## 2022-07-21 ENCOUNTER — Telehealth: Payer: Self-pay | Admitting: Family

## 2022-07-21 NOTE — Telephone Encounter (Signed)
Patient called in stating that she is sick again with bronchitis and would like to know if the rx that you called in for her the last time could be called in for her again?or would you like for her to come in to be seen again? She said that it was an antibiotic,but I did not see it on her med list.

## 2022-08-10 ENCOUNTER — Other Ambulatory Visit: Payer: Self-pay | Admitting: Family

## 2022-08-10 DIAGNOSIS — R0602 Shortness of breath: Secondary | ICD-10-CM

## 2022-08-13 ENCOUNTER — Other Ambulatory Visit: Payer: Self-pay | Admitting: Family

## 2022-08-13 DIAGNOSIS — R0602 Shortness of breath: Secondary | ICD-10-CM

## 2022-08-13 NOTE — Telephone Encounter (Signed)
Patient called in and stated she has about 4 puffs left and a lot of congestion. She is needing her inhaler.

## 2022-08-13 NOTE — Telephone Encounter (Signed)
Called both numbers to get more information both numbers are not working at this time. Please advise patient just received refill 07/06/22. Do you need to see for reevaluation?

## 2022-08-16 NOTE — Telephone Encounter (Signed)
Called pt but said my call could not be completed at this time. Pt need an appt with Tabitha before getting a refill on medication.

## 2022-08-16 NOTE — Telephone Encounter (Signed)
Yes if pt isn't feeling well with congestion, she needs to be seen especially since she used up her last inhaler in a month this means she is using it too much.

## 2022-09-01 ENCOUNTER — Telehealth (INDEPENDENT_AMBULATORY_CARE_PROVIDER_SITE_OTHER): Payer: Medicare Other | Admitting: Family

## 2022-09-01 ENCOUNTER — Encounter: Payer: Self-pay | Admitting: Family

## 2022-09-01 ENCOUNTER — Ambulatory Visit (INDEPENDENT_AMBULATORY_CARE_PROVIDER_SITE_OTHER)
Admission: RE | Admit: 2022-09-01 | Discharge: 2022-09-01 | Disposition: A | Discharge status: Home / Self Care | Payer: Medicare Other | Source: Ambulatory Visit | Attending: Family | Admitting: Family

## 2022-09-01 ENCOUNTER — Ambulatory Visit: Payer: Self-pay

## 2022-09-01 VITALS — BP 140/90 | HR 102 | Temp 98.6°F | Resp 16 | Ht 61.5 in | Wt 110.0 lb

## 2022-09-01 DIAGNOSIS — R0602 Shortness of breath: Secondary | ICD-10-CM | POA: Diagnosis not present

## 2022-09-01 DIAGNOSIS — J42 Unspecified chronic bronchitis: Secondary | ICD-10-CM | POA: Diagnosis not present

## 2022-09-01 DIAGNOSIS — Z720 Tobacco use: Secondary | ICD-10-CM | POA: Diagnosis not present

## 2022-09-01 DIAGNOSIS — E538 Deficiency of other specified B group vitamins: Secondary | ICD-10-CM | POA: Diagnosis not present

## 2022-09-01 DIAGNOSIS — J209 Acute bronchitis, unspecified: Secondary | ICD-10-CM

## 2022-09-01 DIAGNOSIS — J440 Chronic obstructive pulmonary disease with acute lower respiratory infection: Secondary | ICD-10-CM

## 2022-09-01 LAB — CBC
HCT: 43.9 % (ref 36.0–46.0)
Hemoglobin: 14.4 g/dL (ref 12.0–15.0)
MCHC: 32.9 g/dL (ref 30.0–36.0)
MCV: 95.4 fl (ref 78.0–100.0)
Platelets: 397 10*3/uL (ref 150.0–400.0)
RBC: 4.6 Mil/uL (ref 3.87–5.11)
RDW: 14.3 % (ref 11.5–15.5)
WBC: 8.3 10*3/uL (ref 4.0–10.5)

## 2022-09-01 LAB — VITAMIN B12: Vitamin B-12: 671 pg/mL (ref 211–911)

## 2022-09-01 MED ORDER — BEVESPI AEROSPHERE 9-4.8 MCG/ACT IN AERO: 2.0000 | INHALATION_SPRAY | Freq: Two times a day (BID) | RESPIRATORY_TRACT | 3 refills | Status: DC

## 2022-09-01 MED ORDER — NICOTINE 21 MG/24HR TD PT24: 21.0000 mg | MEDICATED_PATCH | Freq: Every day | TRANSDERMAL | 1 refills | Status: DC

## 2022-09-01 MED ORDER — ALBUTEROL SULFATE HFA 108 (90 BASE) MCG/ACT IN AERS: INHALATION_SPRAY | RESPIRATORY_TRACT | 0 refills | Status: DC

## 2022-09-01 MED ORDER — CYANOCOBALAMIN 1000 MCG/ML IJ SOLN
1000.0000 ug | Freq: Once | INTRAMUSCULAR | Status: AC
  Administered 2022-09-01: 1000 ug via INTRAMUSCULAR

## 2022-09-01 MED ORDER — PREDNISONE 20 MG PO TABS: 20.0000 mg | ORAL_TABLET | Freq: Every day | ORAL | 0 refills | Status: AC

## 2022-09-01 MED ORDER — DOXYCYCLINE HYCLATE 100 MG PO TABS: 100.0000 mg | ORAL_TABLET | Freq: Two times a day (BID) | ORAL | 0 refills | Status: AC

## 2022-09-01 NOTE — Assessment & Plan Note (Signed)
Smoking cessation instruction/counseling given:  counseled patient on the dangers of tobacco use, advised patient to stop smoking, and reviewed strategies to maximize success 

## 2022-09-01 NOTE — Assessment & Plan Note (Signed)
B12 1000 mcg IM in office today

## 2022-09-01 NOTE — Progress Notes (Signed)
Emphysema seen but no pneumonia. Take medication as prescribed.

## 2022-09-01 NOTE — Assessment & Plan Note (Signed)
RX doxycycline 100 mg po bid x 10 days Rx prednisone 20 mg , two tablets twice daily for five days  Continue bevespi two puffs twice daily  Chest xray today to r/o pneumonia

## 2022-09-01 NOTE — Progress Notes (Deleted)
MyChart Video Visit    Virtual Visit via Video Note   This visit type was conducted due to national recommendations for restrictions regarding the COVID-19 Pandemic (e.g. social distancing) in an effort to limit this patient's exposure and mitigate transmission in our community. This patient is at least at moderate risk for complications without adequate follow up. This format is felt to be most appropriate for this patient at this time. Physical exam was limited by quality of the video and audio technology used for the visit. CMA was able to get the patient set up on a video visit.  Patient location: Home. Patient and provider in visit Provider location: Office  I discussed the limitations of evaluation and management by telemedicine and the availability of in person appointments. The patient expressed understanding and agreed to proceed.  Visit Date: 09/01/2022  Today's healthcare provider: Mort Sawyersabitha Euclide Granito, FNP     Subjective:    Patient ID: Natalie Haynes, female    DOB: 1951/06/17, 71 y.o.   MRN: [id number]  Chief Complaint  Patient presents with   Cough    Cough      Past Medical History:  Diagnosis Date   Bronchitis    Hearing impaired     Past Surgical History:  Procedure Laterality Date   APPENDECTOMY     CARTILAGE SURGERY     Removed from nose   INNER EAR SURGERY     TUBAL LIGATION      Family History  Problem Relation Age of Onset   Miscarriages / Stillbirths Mother    Hearing loss Mother    Drug abuse Mother    Depression Mother    Alcohol abuse Mother    Brain cancer Mother    Mental illness Mother    Early death Father        killed in [place]   Hearing loss Paternal Grandmother    Stroke Paternal Grandmother     Social History   Socioeconomic History   Marital status: Legally Separated    Spouse name: Not on file   Number of children: Not on file   Years of education: Not on file   Highest education level: Not on file   Occupational History   Occupation: retired  Tobacco Use   Smoking status: Former    Packs/day: 1.50    Years: 40.00    Total pack years: 60.00    Types: Cigarettes   Smokeless tobacco: Never  Vaping Use   Vaping Use: Never used  Substance and Sexual Activity   Alcohol use: Not Currently   Drug use: Not Currently    Types: Marijuana, LSD    Comment: in the 70's not since   Sexual activity: Yes    Partners: Male    Birth control/protection: Surgical  Other Topics Concern   Not on file  Social History Narrative   Not on file   Social Determinants of Health   Financial Resource Strain: Not on file  Food Insecurity: Not on file  Transportation Needs: Not on file  Physical Activity: Not on file  Stress: Not on file  Social Connections: Not on file  Intimate Partner Violence: Not on file    Outpatient Medications Prior to Visit  Medication Sig Dispense Refill   albuterol (VENTOLIN HFA) 108 (90 Base) MCG/ACT inhaler INHALE 2 PUFFS BY MOUTH EVERY 6 HOURS AS NEEDED FOR WHEEZE OR SHORTNESS OF BREATH 18 each 0   Glycopyrrolate-Formoterol (BEVESPI AEROSPHERE) 9-4.8 MCG/ACT AERO Inhale 2 puffs into  the lungs daily. 32.1 g 3   predniSONE (DELTASONE) 20 MG tablet Take two tablets po qd for four days and then one tablet po qd for three days (Patient not taking: Reported on 09/01/2022) 11 tablet 0   No facility-administered medications prior to visit.    Allergies  Allergen Reactions   Codeine     REACTION: nausea, abd cramps   Ibuprofen     REACTION: antiphylactic shock   Penicillins     REACTION: rash    Review of Systems  Respiratory:  Positive for cough.        Objective:    Physical Exam Constitutional:      General: She is not in acute distress.    Appearance: Normal appearance. She is not ill-appearing or toxic-appearing.  Pulmonary:     Effort: Pulmonary effort is normal.     Breath sounds: Wheezing present.  Neurological:     General: No focal deficit  present.     Mental Status: She is alert and oriented to person, place, and time. Mental status is at baseline.  Psychiatric:        Mood and Affect: Mood normal.        Behavior: Behavior normal.        Thought Content: Thought content normal.        Judgment: Judgment normal.     BP (!) 140/90   Pulse (!) 102   Temp 98.6 F (37 C)   Resp 16   Ht 5' 1.5" (1.562 m)   Wt 110 lb (49.9 kg)   SpO2 93%   BMI 20.45 kg/m  Wt Readings from Last 3 Encounters:  09/01/22 110 lb (49.9 kg)  05/19/22 113 lb 4 oz (51.4 kg)  05/05/22 116 lb 4 oz (52.7 kg)       Assessment & Plan:   Problem List Items Addressed This Visit       Respiratory   Chronic bronchitis (HCC)    Unstable. Referral made to pulmonary as well to eval/treat Possible need for spirometry as we do not have in office.      Relevant Medications   Glycopyrrolate-Formoterol (BEVESPI AEROSPHERE) 9-4.8 MCG/ACT AERO   Acute bronchitis with COPD (HCC) - Primary    RX doxycycline 100 mg po bid x 10 days Rx prednisone 20 mg , two tablets twice daily for five days  Continue bevespi two puffs twice daily  Chest xray today to r/o pneumonia        Relevant Medications   albuterol (VENTOLIN HFA) 108 (90 Base) MCG/ACT inhaler   doxycycline (VIBRA-TABS) 100 MG tablet   predniSONE (DELTASONE) 20 MG tablet   nicotine (NICODERM CQ - DOSED IN MG/24 HOURS) 21 mg/24hr patch   Glycopyrrolate-Formoterol (BEVESPI AEROSPHERE) 9-4.8 MCG/ACT AERO   Other Relevant Orders   Ambulatory referral to Pulmonology   DG Chest 2 View (Completed)   CBC     Other   SOB (shortness of breath)   Relevant Medications   albuterol (VENTOLIN HFA) 108 (90 Base) MCG/ACT inhaler   predniSONE (DELTASONE) 20 MG tablet   Glycopyrrolate-Formoterol (BEVESPI AEROSPHERE) 9-4.8 MCG/ACT AERO   Other Relevant Orders   Ambulatory referral to Pulmonology   DG Chest 2 View (Completed)   CBC   Tobacco abuse    Smoking cessation instruction/counseling given:   counseled patient on the dangers of tobacco use, advised patient to stop smoking, and reviewed strategies to maximize success       Relevant Medications   nicotine (  NICODERM CQ - DOSED IN MG/24 HOURS) 21 mg/24hr patch   B12 deficiency    B12 1000 mcg IM in office today       Relevant Orders   CBC   Vitamin B12    I have discontinued Pamala Hurry Glazer's predniSONE. I have also changed her [place]. Additionally, I am having her start on doxycycline, predniSONE, and nicotine. Lastly, I am having her maintain her albuterol. We administered cyanocobalamin.  Meds ordered this encounter  Medications   albuterol (VENTOLIN HFA) 108 (90 Base) MCG/ACT inhaler    Sig: INHALE 2 PUFFS BY MOUTH EVERY 6 HOURS AS NEEDED FOR WHEEZE OR SHORTNESS OF BREATH    Dispense:  18 each    Refill:  0   doxycycline (VIBRA-TABS) 100 MG tablet    Sig: Take 1 tablet (100 mg total) by mouth 2 (two) times daily for 10 days.    Dispense:  20 tablet    Refill:  0    Order Specific Question:   Supervising Provider    Answer:   BEDSOLE, AMY E [[id number]]   predniSONE (DELTASONE) 20 MG tablet    Sig: Take 1 tablet (20 mg total) by mouth daily with breakfast for 10 days.    Dispense:  10 tablet    Refill:  0    Order Specific Question:   Supervising Provider    Answer:   BEDSOLE, AMY E [[id number]]   nicotine (NICODERM CQ - DOSED IN MG/24 HOURS) 21 mg/24hr patch    Sig: Place 1 patch (21 mg total) onto the skin daily.    Dispense:  28 patch    Refill:  1    Order Specific Question:   Supervising Provider    Answer:   BEDSOLE, AMY E [[id number]]   Glycopyrrolate-Formoterol (BEVESPI AEROSPHERE) 9-4.8 MCG/ACT AERO    Sig: Inhale 2 puffs into the lungs in the morning and at bedtime.    Dispense:  32.1 g    Refill:  3    Order Specific Question:   Supervising Provider    Answer:   BEDSOLE, AMY E [[id number]]   cyanocobalamin (VITAMIN B12) injection 1,000 mcg    I discussed the assessment and treatment plan with the patient.  The patient was provided an opportunity to ask questions and all were answered. The patient agreed with the plan and demonstrated an understanding of the instructions.   The patient was advised to call back or seek an in-person evaluation if the symptoms worsen or if the condition fails to improve as anticipated.  I provided 15 minutes of face-to-face time during this encounter.   Eugenia Pancoast, [place] [facility] [place] [phone] (phone) [phone] (fax)  [place]

## 2022-09-01 NOTE — Assessment & Plan Note (Signed)
Unstable. Referral made to pulmonary as well to eval/treat Possible need for spirometry as we do not have in office.

## 2022-09-08 ENCOUNTER — Telehealth: Payer: Self-pay | Admitting: Family

## 2022-09-08 NOTE — Telephone Encounter (Signed)
Patient called and stated she wants to discuss and pulmonary appointment. Call back number [phone].

## 2022-09-09 NOTE — Telephone Encounter (Signed)
Spoke to Patient about Pulmonology referral. Patient will call them to schedule.

## 2022-09-12 NOTE — Addendum Note (Signed)
Addended by: Mort Sawyers on: 09/12/2022 12:21 PM   Modules accepted: Level of Service

## 2022-09-12 NOTE — Progress Notes (Addendum)
Established Patient Office Visit  Subjective:  Patient ID: Natalie Haynes, female    DOB: 04/20/1951  Age: 71 y.o. MRN: [id number]  CC:  Chief Complaint  Patient presents with   Cough    HPI Natalie Haynes is here today with concerns.   Pt here today via video visit with concerns.   Has noticed she is having spells getting a deep breath in . She started with cold symptoms about one month ago, but she was not able to get into the office because she did not have a working car.   C/o chest congestion, and has cough that is nonproductive. She is taking something over the counter 'geritussin' which is helping her cough up the stuff.she does have dry hoarse throat and thinks its hoarse from coughing. Has also had a lot of sinus drainage.when she lies down she is worse with coughing and can't stop  She is taking her bevespi two puffs twice daily.   Pt is a smoker, has tried nicotine patch in the past, but not long enough to actually quit.   Past Medical History:  Diagnosis Date   Bronchitis    Hearing impaired     Past Surgical History:  Procedure Laterality Date   APPENDECTOMY     CARTILAGE SURGERY     Removed from nose   INNER EAR SURGERY     TUBAL LIGATION      Family History  Problem Relation Age of Onset   96 / Stillbirths Mother    Hearing loss Mother    Drug abuse Mother    Depression Mother    Alcohol abuse Mother    Brain cancer Mother    Mental illness Mother    Early death Father        killed in [place]   Hearing loss Paternal Grandmother    Stroke Paternal Grandmother     Social History   Socioeconomic History   Marital status: Legally Separated    Spouse name: Not on file   Number of children: Not on file   Years of education: Not on file   Highest education level: Not on file  Occupational History   Occupation: retired  Tobacco Use   Smoking status: Former    Packs/day: 1.50    Years: 40.00    Total pack years: 60.00    Types:  Cigarettes   Smokeless tobacco: Never  Vaping Use   Vaping Use: Never used  Substance and Sexual Activity   Alcohol use: Not Currently   Drug use: Not Currently    Types: Marijuana, LSD    Comment: in the 70's not since   Sexual activity: Yes    Partners: Male    Birth control/protection: Surgical  Other Topics Concern   Not on file  Social History Narrative   Not on file   Social Determinants of Health   Financial Resource Strain: Not on file  Food Insecurity: Not on file  Transportation Needs: Not on file  Physical Activity: Not on file  Stress: Not on file  Social Connections: Not on file  Intimate Partner Violence: Not on file    Outpatient Medications Prior to Visit  Medication Sig Dispense Refill   albuterol (VENTOLIN HFA) 108 (90 Base) MCG/ACT inhaler INHALE 2 PUFFS BY MOUTH EVERY 6 HOURS AS NEEDED FOR WHEEZE OR SHORTNESS OF BREATH 18 each 0   Glycopyrrolate-Formoterol (BEVESPI AEROSPHERE) 9-4.8 MCG/ACT AERO Inhale 2 puffs into the lungs daily. 32.1 g 3   predniSONE (  DELTASONE) 20 MG tablet Take two tablets po qd for four days and then one tablet po qd for three days (Patient not taking: Reported on 09/01/2022) 11 tablet 0   No facility-administered medications prior to visit.    Allergies  Allergen Reactions   Codeine     REACTION: nausea, abd cramps   Ibuprofen     REACTION: antiphylactic shock   Penicillins     REACTION: rash        Objective:    Physical Exam Vitals reviewed.   Constitutional:      General: She is not in acute distress.    Appearance: Normal appearance. She is not ill-appearing or toxic-appearing.  Pulmonary:     Effort: Pulmonary effort is normal.     Breath sounds: Wheezing present.  Neurological:     General: No focal deficit present.     Mental Status: She is alert and oriented to person, place, and time. Mental status is at baseline.  Psychiatric:        Mood and Affect: Mood normal.        Behavior: Behavior normal.         Thought Content: Thought content normal.        Judgment: Judgment normal.   BP (!) 140/90   Pulse (!) 102   Temp 98.6 F (37 C)   Resp 16   Ht 5' 1.5" (1.562 m)   Wt 110 lb (49.9 kg)   SpO2 93%   BMI 20.45 kg/m  Wt Readings from Last 3 Encounters:  09/01/22 110 lb (49.9 kg)  05/19/22 113 lb 4 oz (51.4 kg)  05/05/22 116 lb 4 oz (52.7 kg)     Health Maintenance Due  Topic Date Due   Medicare Annual Wellness (AWV)  Never done   COVID-19 Vaccine (1) Never done   Hepatitis C Screening  Never done   DTaP/Tdap/Td (1 - Tdap) Never done   COLONOSCOPY (Pts 45-68yrs Insurance coverage will need to be confirmed)  Never done   Lung Cancer Screening  Never done   MAMMOGRAM  Never done   Zoster Vaccines- Shingrix (1 of 2) Never done   Pneumonia Vaccine 69+ Years old (1 - PCV) Never done   DEXA SCAN  Never done   INFLUENZA VACCINE  Never done    There are no preventive care reminders to display for this patient.  Lab Results  Component Value Date   TSH 1.32 05/19/2022   Lab Results  Component Value Date   WBC 8.3 09/01/2022   HGB 14.4 09/01/2022   HCT 43.9 09/01/2022   MCV 95.4 09/01/2022   PLT 397.0 09/01/2022   Lab Results  Component Value Date   NA 136 05/05/2022   K 4.7 05/05/2022   CO2 30 05/05/2022   GLUCOSE 89 05/05/2022   BUN 13 05/05/2022   CREATININE 0.59 05/05/2022   BILITOT 0.4 05/05/2022   ALKPHOS 59 05/05/2022   AST 14 05/05/2022   ALT 6 05/05/2022   PROT 7.4 05/05/2022   ALBUMIN 4.5 05/05/2022   CALCIUM 9.5 05/05/2022   ANIONGAP 7 02/26/2014   GFR 91.89 05/05/2022   No results found for: "HGBA1C"    Assessment & Plan:   Problem List Items Addressed This Visit       Respiratory   Chronic bronchitis (HCC)    Unstable. Referral made to pulmonary as well to eval/treat Possible need for spirometry as we do not have in office.      Relevant  Medications   Glycopyrrolate-Formoterol (BEVESPI AEROSPHERE) 9-4.8 MCG/ACT AERO   Acute  bronchitis with COPD (HCC) - Primary    RX doxycycline 100 mg po bid x 10 days Rx prednisone 20 mg , two tablets twice daily for five days  Continue bevespi two puffs twice daily  Chest xray today to r/o pneumonia        Relevant Medications   albuterol (VENTOLIN HFA) 108 (90 Base) MCG/ACT inhaler   nicotine (NICODERM CQ - DOSED IN MG/24 HOURS) 21 mg/24hr patch   Glycopyrrolate-Formoterol (BEVESPI AEROSPHERE) 9-4.8 MCG/ACT AERO   Other Relevant Orders   Ambulatory referral to Pulmonology   DG Chest 2 View (Completed)   CBC (Completed)     Other   SOB (shortness of breath)   Relevant Medications   albuterol (VENTOLIN HFA) 108 (90 Base) MCG/ACT inhaler   Glycopyrrolate-Formoterol (BEVESPI AEROSPHERE) 9-4.8 MCG/ACT AERO   Other Relevant Orders   Ambulatory referral to Pulmonology   DG Chest 2 View (Completed)   CBC (Completed)   Tobacco abuse    Smoking cessation instruction/counseling given:  counseled patient on the dangers of tobacco use, advised patient to stop smoking, and reviewed strategies to maximize success       Relevant Medications   nicotine (NICODERM CQ - DOSED IN MG/24 HOURS) 21 mg/24hr patch   B12 deficiency    B12 1000 mcg IM in office today       Relevant Orders   CBC (Completed)   Vitamin B12 (Completed)    Meds ordered this encounter  Medications   albuterol (VENTOLIN HFA) 108 (90 Base) MCG/ACT inhaler    Sig: INHALE 2 PUFFS BY MOUTH EVERY 6 HOURS AS NEEDED FOR WHEEZE OR SHORTNESS OF BREATH    Dispense:  18 each    Refill:  0   doxycycline (VIBRA-TABS) 100 MG tablet    Sig: Take 1 tablet (100 mg total) by mouth 2 (two) times daily for 10 days.    Dispense:  20 tablet    Refill:  0    Order Specific Question:   Supervising Provider    Answer:   BEDSOLE, AMY E [[id number]]   predniSONE (DELTASONE) 20 MG tablet    Sig: Take 1 tablet (20 mg total) by mouth daily with breakfast for 10 days.    Dispense:  10 tablet    Refill:  0    Order Specific  Question:   Supervising Provider    Answer:   BEDSOLE, AMY E [[id number]]   nicotine (NICODERM CQ - DOSED IN MG/24 HOURS) 21 mg/24hr patch    Sig: Place 1 patch (21 mg total) onto the skin daily.    Dispense:  28 patch    Refill:  1    Order Specific Question:   Supervising Provider    Answer:   BEDSOLE, AMY E [[id number]]   Glycopyrrolate-Formoterol (BEVESPI AEROSPHERE) 9-4.8 MCG/ACT AERO    Sig: Inhale 2 puffs into the lungs in the morning and at bedtime.    Dispense:  32.1 g    Refill:  3    Order Specific Question:   Supervising Provider    Answer:   BEDSOLE, AMY E [[id number]]   cyanocobalamin (VITAMIN B12) injection 1,000 mcg    Follow-up: No follow-ups on file.    Mort Sawyers, FNP

## 2022-09-13 ENCOUNTER — Ambulatory Visit: Payer: Medicare Other | Admitting: Pulmonary Disease

## 2022-10-10 ENCOUNTER — Other Ambulatory Visit: Payer: Self-pay | Admitting: Family

## 2022-10-10 DIAGNOSIS — R0602 Shortness of breath: Secondary | ICD-10-CM

## 2022-10-26 ENCOUNTER — Institutional Professional Consult (permissible substitution): Payer: Medicare Other | Admitting: Pulmonary Disease

## 2022-11-11 ENCOUNTER — Ambulatory Visit (INDEPENDENT_AMBULATORY_CARE_PROVIDER_SITE_OTHER): Payer: 59 | Admitting: Student in an Organized Health Care Education/Training Program

## 2022-11-11 ENCOUNTER — Encounter: Payer: Self-pay | Admitting: Student in an Organized Health Care Education/Training Program

## 2022-11-11 VITALS — BP 140/78 | HR 133 | Temp 98.0°F | Ht 61.5 in | Wt 112.2 lb

## 2022-11-11 DIAGNOSIS — Z720 Tobacco use: Secondary | ICD-10-CM

## 2022-11-11 DIAGNOSIS — R0602 Shortness of breath: Secondary | ICD-10-CM

## 2022-11-11 MED ORDER — NICOTINE POLACRILEX 4 MG MT LOZG: 4.0000 mg | LOZENGE | OROMUCOSAL | 3 refills | Status: DC | PRN

## 2022-11-11 MED ORDER — ANORO ELLIPTA 62.5-25 MCG/ACT IN AEPB: 1.0000 | INHALATION_SPRAY | Freq: Every day | RESPIRATORY_TRACT | 12 refills | Status: DC

## 2022-11-11 NOTE — Progress Notes (Signed)
Synopsis: Referred in for shortness of breath by Eugenia Pancoast, FNP  Assessment & Plan:   1. Shortness of breath  Presenting for the evaluation of shortness of breath, cough, and sputum production consistent with COPD/bronchitis given her smoking history. I will order a pulmonary function test to assess her degree of obstruction and re-fill her Anoro Ellipta. The patient will also require a CT scan to evaluate the nodule reported by her PCP (I don't have access to said imaging study). This would also help workup her shortness of breath.  - Pulmonary Function Test [facility] Only; Future - CT CHEST WO CONTRAST; Future - ANORO ELLIPTA 62.5-25 MCG/ACT AEPB; Inhale 1 puff into the lungs daily.  Dispense: 30 each; Refill: 12  2. Tobacco abuse  Patient counseled extensively on the importance of smoking cessation. She has patches and I will supplement her regimen with lozenges.  - nicotine polacrilex (NICOTINE MINI) 4 MG lozenge; Take 1 lozenge (4 mg total) by mouth every 2 (two) hours as needed for smoking cessation.  Dispense: 100 tablet; Refill: 3   Return in about 2 months (around 01/10/2023).  I spent 45 minutes caring for this patient today, including preparing to see the patient, obtaining a medical history , reviewing a separately obtained history, performing a medically appropriate examination and/or evaluation, counseling and educating the patient/family/caregiver, ordering medications, tests, or procedures, documenting clinical information in the electronic health record, and independently interpreting results (not separately reported/billed) and communicating results to the patient/family/caregiver  Armando Reichert, MD [facility] 11/11/2022 3:01 PM    End of visit medications:  Meds ordered this encounter  Medications   nicotine polacrilex (NICOTINE MINI) 4 MG lozenge    Sig: Take 1 lozenge (4 mg total) by mouth every 2 (two) hours as needed for smoking cessation.     Dispense:  100 tablet    Refill:  3   ANORO ELLIPTA 62.5-25 MCG/ACT AEPB    Sig: Inhale 1 puff into the lungs daily.    Dispense:  30 each    Refill:  12     Current Outpatient Medications:    albuterol (VENTOLIN HFA) 108 (90 Base) MCG/ACT inhaler, INHALE 2 PUFFS BY MOUTH EVERY 6 HOURS AS NEEDED FOR WHEEZE OR SHORTNESS OF BREATH, Disp: 8.5 each, Rfl: 0   nicotine polacrilex (NICOTINE MINI) 4 MG lozenge, Take 1 lozenge (4 mg total) by mouth every 2 (two) hours as needed for smoking cessation., Disp: 100 tablet, Rfl: 3   ANORO ELLIPTA 62.5-25 MCG/ACT AEPB, Inhale 1 puff into the lungs daily., Disp: 30 each, Rfl: 12   nicotine (NICODERM CQ - DOSED IN MG/24 HOURS) 21 mg/24hr patch, Place 1 patch (21 mg total) onto the skin daily., Disp: 28 patch, Rfl: 1   Subjective:   PATIENT ID: Natalie Haynes GENDER: female DOB: 1951/08/22, MRN: [id number]  Chief Complaint  Patient presents with   pulmonary consult    SOB with exertion and prod cough with white to yellow sputum.     HPI  Natalie Haynes is a 72 year old female presenting to clinic for the evaluation of shortness of breath.  She reports exertional dyspnea that has worsened over the past two years. In addition to dyspnea, she reports cough productive of whitish sputum, in addition to some wheezing. She has no chest pain, no chest tightness, no fevers, and no chills. She denies hemoptysis. She has not had any issues with breathing in her youth.  She has been seen by her  PCP and has been maintained on inhalers, with improvement. She was initially on Bevespi, switched to Cisco. She feels the Anoro has helped tremendously with her symptoms. She was also noted to have some weight loss by her PCP as well as a report of a nodule on a chest xray. A CT of the chest was ordered but has yet to be performed.  The patient is a smoker and has smoked two packs a day for a long time, with at least 100 pack years of smoking history. 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  Ancillary information including prior medications, full medical/surgical/family/social histories, and PFTs (when available) are listed below and have been reviewed.   Review of Systems  Constitutional:  Negative for chills and fever.  Respiratory:  Positive for cough, shortness of breath and wheezing.   Cardiovascular:  Negative for chest pain and PND.     Objective:   Vitals:   11/11/22 1419  BP: (!) 140/78  Pulse: (!) 133  Temp: 98 F (36.7 C)  TempSrc: Temporal  SpO2: 93%  Weight: 112 lb 3.2 oz (50.9 kg)  Height: 5' 1.5" (1.562 m)   93% on RA  BMI Readings from Last 3 Encounters:  11/11/22 20.86 kg/m  09/01/22 20.45 kg/m  05/19/22 21.05 kg/m   Wt Readings from Last 3 Encounters:  11/11/22 112 lb 3.2 oz (50.9 kg)  09/01/22 110 lb (49.9 kg)  05/19/22 113 lb 4 oz (51.4 kg)    Physical Exam Constitutional:      General: She is not in acute distress.    Appearance: Normal appearance. She is not ill-appearing.  HENT:     Head: Normocephalic.     Nose: Nose normal.     Mouth/Throat:     Mouth: Mucous membranes are moist.  Cardiovascular:     Rate and Rhythm: Normal rate and regular rhythm.     Pulses: Normal pulses.     Heart sounds: Normal heart sounds.  Pulmonary:     Effort: Pulmonary effort is normal.     Breath sounds: Normal breath sounds. No wheezing or rales.  Abdominal:     Palpations: Abdomen is soft.  Musculoskeletal:     Comments: Yellow discolored nails  Neurological:     General: No focal deficit present.     Mental Status: She is alert and oriented to person, place, and time.     Ancillary Information    Past Medical History:  Diagnosis Date   Bronchitis    Hearing impaired      Family History  Problem Relation Age of Onset   Miscarriages / Stillbirths Mother    Hearing loss Mother    Drug abuse Mother    Depression Mother    Alcohol abuse Mother    Brain cancer Mother    Mental  illness Mother    Early death Father        killed in [place]   Hearing loss Paternal Grandmother    Stroke Paternal Grandmother      Past Surgical History:  Procedure Laterality Date   APPENDECTOMY     CARTILAGE SURGERY     Removed from nose   INNER EAR SURGERY     TUBAL LIGATION      Social History   Socioeconomic History   Marital status: Legally Separated    Spouse name: Not on file   Number of children: Not on file   Years of education: Not on file  Highest education level: Not on file  Occupational History   Occupation: retired  Tobacco Use   Smoking status: Every Day    Packs/day: 2.00    Years: 50.00    Total pack years: 100.00    Types: Cigarettes   Smokeless tobacco: Never  Vaping Use   Vaping Use: Never used  Substance and Sexual Activity   Alcohol use: Not Currently   Drug use: Not Currently    Types: Marijuana, LSD    Comment: in the 70's not since   Sexual activity: Yes    Partners: Male    Birth control/protection: Surgical  Other Topics Concern   Not on file  Social History Narrative   Not on file   Social Determinants of Health   Financial Resource Strain: Not on file  Food Insecurity: Not on file  Transportation Needs: Not on file  Physical Activity: Not on file  Stress: Not on file  Social Connections: Not on file  Intimate Partner Violence: Not on file     Allergies  Allergen Reactions   Codeine     REACTION: nausea, abd cramps   Ibuprofen     REACTION: antiphylactic shock   Penicillins     REACTION: rash     CBC    Component Value Date/Time   WBC 8.3 09/01/2022 0944   RBC 4.60 09/01/2022 0944   HGB 14.4 09/01/2022 0944   HGB 14.3 02/26/2014 1343   HCT 43.9 09/01/2022 0944   HCT 43.5 02/26/2014 1343   PLT 397.0 09/01/2022 0944   PLT 414 02/26/2014 1343   MCV 95.4 09/01/2022 0944   MCV 100 02/26/2014 1343   MCH 33.0 02/26/2014 1343   MCHC 32.9 09/01/2022 0944   RDW 14.3 09/01/2022 0944   RDW 13.8 02/26/2014 1343    LYMPHSABS 1.0 05/19/2022 1032   LYMPHSABS 0.9 (L) 02/26/2014 1343   MONOABS 0.4 05/19/2022 1032   MONOABS 0.4 02/26/2014 1343   EOSABS 0.2 05/19/2022 1032   EOSABS 0.0 02/26/2014 1343   BASOSABS 0.1 05/19/2022 1032   BASOSABS 0.1 02/26/2014 1343    Pulmonary Functions Testing Results:     No data to display          Outpatient Medications Prior to Visit  Medication Sig Dispense Refill   albuterol (VENTOLIN HFA) 108 (90 Base) MCG/ACT inhaler INHALE 2 PUFFS BY MOUTH EVERY 6 HOURS AS NEEDED FOR WHEEZE OR SHORTNESS OF BREATH 8.5 each 0   ANORO ELLIPTA 62.5-25 MCG/ACT AEPB Inhale 1 puff into the lungs daily.     Glycopyrrolate-Formoterol (BEVESPI AEROSPHERE) 9-4.8 MCG/ACT AERO Inhale 2 puffs into the lungs in the morning and at bedtime. 32.1 g 3   nicotine (NICODERM CQ - DOSED IN MG/24 HOURS) 21 mg/24hr patch Place 1 patch (21 mg total) onto the skin daily. 28 patch 1   No facility-administered medications prior to visit.

## 2022-11-18 ENCOUNTER — Other Ambulatory Visit: Payer: Self-pay | Admitting: Family

## 2022-11-18 DIAGNOSIS — R0602 Shortness of breath: Secondary | ICD-10-CM

## 2022-11-23 ENCOUNTER — Ambulatory Visit
Admission: RE | Admit: 2022-11-23 | Discharge: 2022-11-23 | Disposition: A | Discharge status: Home / Self Care | Payer: 59 | Source: Ambulatory Visit | Attending: Student in an Organized Health Care Education/Training Program | Admitting: Student in an Organized Health Care Education/Training Program

## 2022-11-23 DIAGNOSIS — R0609 Other forms of dyspnea: Secondary | ICD-10-CM | POA: Diagnosis not present

## 2022-11-23 DIAGNOSIS — R911 Solitary pulmonary nodule: Secondary | ICD-10-CM | POA: Insufficient documentation

## 2022-11-23 DIAGNOSIS — F17210 Nicotine dependence, cigarettes, uncomplicated: Secondary | ICD-10-CM | POA: Insufficient documentation

## 2022-11-23 DIAGNOSIS — I700 Atherosclerosis of aorta: Secondary | ICD-10-CM | POA: Insufficient documentation

## 2022-11-23 DIAGNOSIS — R0602 Shortness of breath: Secondary | ICD-10-CM | POA: Insufficient documentation

## 2022-11-23 DIAGNOSIS — Z7951 Long term (current) use of inhaled steroids: Secondary | ICD-10-CM | POA: Diagnosis not present

## 2022-11-24 ENCOUNTER — Institutional Professional Consult (permissible substitution): Payer: Medicare Other | Admitting: Pulmonary Disease

## 2022-12-01 ENCOUNTER — Telehealth: Payer: Self-pay

## 2022-12-01 NOTE — Telephone Encounter (Signed)
Patient called states that she started having some issues with eye on Monday. States that she sees flashes of light when blinking eye. Has improved over time. Denies any injury to head or eye. Has had some neck and head ache. Describes as dull ache.   Also was told that she had kidney stone by pulmonology and advised to call PCP.   I have reviewed red words with her. If any she will go to ED. Office visit made to have evaluation with Dr. Diona Browner patient does not want to drive until temperature is warm outside.

## 2022-12-02 ENCOUNTER — Ambulatory Visit: Payer: 59 | Admitting: Family Medicine

## 2022-12-02 ENCOUNTER — Institutional Professional Consult (permissible substitution): Payer: Medicare Other | Admitting: Pulmonary Disease

## 2022-12-02 NOTE — Telephone Encounter (Signed)
Patient cancelled appointment today.  Now scheduled with Dugal on 12/07/2022 at 12:20 pm .  Will have triage call and speak with patient.

## 2022-12-02 NOTE — Telephone Encounter (Signed)
Unable to reach pt by phone; call goes directly to v/m. Austin on phones said that when pt called she said her brother had had a stroke and she was his care giver and pt changed appt to next wk. I spoke with pts son and he said that pt always cuts her phone off. Marcello Moores said that he will go to pts home to make sure everything is OK and I gave Marcello Moores Dr Arley Phenix note and he is not sure if pt has an eye dr or not but will give pt Dr Arley Phenix statement and Marcello Moores will have pt call [place]. Sending note to Dr Diona Browner, Eugenia Pancoast FNP as PCP and [facility] pool. And I will teams Butch Penny CMA.

## 2022-12-02 NOTE — Telephone Encounter (Signed)
Noted  

## 2022-12-02 NOTE — Telephone Encounter (Signed)
Thank you for reaching out to speak with patient, and DPR.  Thank you Dr. Diona Browner for your recommendations, appreciated.

## 2022-12-07 ENCOUNTER — Ambulatory Visit (INDEPENDENT_AMBULATORY_CARE_PROVIDER_SITE_OTHER): Payer: 59 | Admitting: Family

## 2022-12-07 ENCOUNTER — Encounter: Payer: Self-pay | Admitting: Family

## 2022-12-07 ENCOUNTER — Other Ambulatory Visit: Payer: Self-pay | Admitting: Family

## 2022-12-07 VITALS — BP 140/88 | HR 128 | Temp 97.5°F | Ht 61.5 in | Wt 110.0 lb

## 2022-12-07 DIAGNOSIS — H539 Unspecified visual disturbance: Secondary | ICD-10-CM

## 2022-12-07 DIAGNOSIS — N3000 Acute cystitis without hematuria: Secondary | ICD-10-CM | POA: Insufficient documentation

## 2022-12-07 DIAGNOSIS — R350 Frequency of micturition: Secondary | ICD-10-CM | POA: Diagnosis not present

## 2022-12-07 DIAGNOSIS — R0602 Shortness of breath: Secondary | ICD-10-CM

## 2022-12-07 DIAGNOSIS — R82998 Other abnormal findings in urine: Secondary | ICD-10-CM | POA: Diagnosis not present

## 2022-12-07 LAB — POC URINALSYSI DIPSTICK (AUTOMATED)
Bilirubin, UA: NEGATIVE
Blood, UA: NEGATIVE
Glucose, UA: NEGATIVE
Ketones, UA: NEGATIVE
Nitrite, UA: NEGATIVE
Protein, UA: POSITIVE — AB
Spec Grav, UA: 1.02 (ref 1.010–1.025)
Urobilinogen, UA: 0.2 E.U./dL
pH, UA: 6 (ref 5.0–8.0)

## 2022-12-07 MED ORDER — SULFAMETHOXAZOLE-TRIMETHOPRIM 800-160 MG PO TABS: 1.0000 | ORAL_TABLET | Freq: Two times a day (BID) | ORAL | 0 refills | Status: AC

## 2022-12-07 NOTE — Assessment & Plan Note (Signed)
Worry for retinal detachment, pt advised and recommended to go to ER however pt declines.  Urgent referral ophthalmologist placed.

## 2022-12-07 NOTE — Assessment & Plan Note (Signed)
Using daily albuterol did advise pt to f/u with pulmonary for this as she does use this daily with anoro. May need additional maintenance medication.

## 2022-12-07 NOTE — Patient Instructions (Signed)
  It was a pleasure seeing you today.   You were found to have a urinary tract infection, you have been prescribed an antibiotic to your preferred pharmacy. Please start antibiotic today as directed.   We are sending your urine for a culture to make sure you do not have a resistant bacteria. We will call you if we need to change your medications.   Please make sure you are drinking plenty of fluids over the next few days.  If your symptoms do not improve over the next 5-7 days, or if they worsen, please let us know. Please also let us know if you have worsening back pain, fevers, chills, or body aches.   Regards,   Joyanna Kleman  Regards,   Eugenia Pancoast FNP-C

## 2022-12-07 NOTE — Progress Notes (Signed)
Established Patient Office Visit  Subjective:   Patient ID: Natalie Haynes, female    DOB: 1951/07/05  Age: 72 y.o. MRN: [id number]  CC:  Chief Complaint  Patient presents with   Eye Problem    Getting flashes of light in right eye for over a week.    Urinary Frequency    Was told on ct that she had kidney stone has had frequency for 5 weeks     HPI: Natalie Haynes is a 72 y.o. female presenting on 12/07/2022 for Eye Problem (Getting flashes of light in right eye for over a week. ) and Urinary Frequency (Was told on ct that she had kidney stone has had frequency for 5 weeks )   Eye Problem   Urinary Frequency  Associated symptoms include frequency.    Does not have an eye doctor. She notices these more when she turns to the right and or when she blinks, streaks of light in her eyes. Does not describe them as floaters. No complete loss of vision and or black current change in light. This started last Monday. Has been ongoing since then. No headache ear pain and or sinus pressure.   C/o urinary frequency which is ongoing was told she had a kidney stone on recent imaging . Denies flank pain and or pain in the lower abdomen. Denies dysuria. Urinary frequency has been over the last few months, pees at least once every night. Even when she finishes peeing she feels she has to go again. Has noticed frequency even worse over the last few weeks or two  B12 def last visit 05/19/22, had improvement from 05/19/22. She recently ran out but will pick up more.  Lab Results  Component Value Date   [id number] 09/01/2022   Emphysema: is taking anoro daily however she also is taking albuterol inhaler 1-2 times a day.           ROS: Negative unless specifically indicated above in HPI.   Relevant past medical history reviewed and updated as indicated.   Allergies and medications reviewed and updated.   Current Outpatient Medications:    albuterol (VENTOLIN HFA) 108 (90 Base) MCG/ACT  inhaler, INHALE 2 PUFFS BY MOUTH EVERY 6 HOURS AS NEEDED FOR WHEEZE OR SHORTNESS OF BREATH, Disp: 8.5 each, Rfl: 0   ANORO ELLIPTA 62.5-25 MCG/ACT AEPB, Inhale 1 puff into the lungs daily., Disp: 30 each, Rfl: 12   nicotine (NICODERM CQ - DOSED IN MG/24 HOURS) 21 mg/24hr patch, Place 1 patch (21 mg total) onto the skin daily., Disp: 28 patch, Rfl: 1   sulfamethoxazole-trimethoprim (BACTRIM DS) 800-160 MG tablet, Take 1 tablet by mouth 2 (two) times daily for 7 days., Disp: 14 tablet, Rfl: 0   nicotine polacrilex (NICOTINE MINI) 4 MG lozenge, Take 1 lozenge (4 mg total) by mouth every 2 (two) hours as needed for smoking cessation. (Patient not taking: Reported on 12/07/2022), Disp: 100 tablet, Rfl: 3  Allergies  Allergen Reactions   Codeine     REACTION: nausea, abd cramps   Ibuprofen     REACTION: antiphylactic shock   Penicillins     REACTION: rash    Objective:   BP (!) 140/88   Pulse (!) 128   Temp (!) 97.5 F (36.4 C) (Temporal)   Ht 5' 1.5" (1.562 m)   Wt 110 lb (49.9 kg)   SpO2 95%   BMI 20.45 kg/m    Physical Exam Constitutional:      General: She is  not in acute distress.    Appearance: Normal appearance. She is normal weight. She is not ill-appearing, toxic-appearing or diaphoretic.  HENT:     Head: Normocephalic.     Right Ear: Tympanic membrane normal.     Left Ear: Tympanic membrane normal.     Nose: Nose normal.     Mouth/Throat:     Mouth: Mucous membranes are dry.     Pharynx: No oropharyngeal exudate or posterior oropharyngeal erythema.  Eyes:     General: Lids are normal. No allergic shiner or visual field deficit.    Extraocular Movements: Extraocular movements intact.     Right eye: Normal extraocular motion and no nystagmus.     Left eye: Normal extraocular motion and no nystagmus.     Pupils: Pupils are equal, round, and reactive to light.  Cardiovascular:     Rate and Rhythm: Normal rate and regular rhythm.     Pulses: Normal pulses.     Heart  sounds: Normal heart sounds.  Pulmonary:     Effort: Pulmonary effort is normal.     Breath sounds: Normal breath sounds.  Musculoskeletal:     Cervical back: Normal range of motion.  Neurological:     General: No focal deficit present.     Mental Status: She is alert and oriented to person, place, and time. Mental status is at baseline.  Psychiatric:        Mood and Affect: Mood normal.        Behavior: Behavior normal.        Thought Content: Thought content normal.        Judgment: Judgment normal.     Assessment & Plan:  Urinary frequency -     POCT Urinalysis Dipstick (Automated)  Leukocytes in urine -     Urine Culture  Acute cystitis without hematuria Assessment & Plan: poct urine dip in office Urine culture ordered pending results antbx sent to pharmacy, pt to take as directed. Encouraged increased water intake throughout the day. Choosing to treat due to being symptomatic. If no improvement in the next 2 days pt advised to let me know.   Orders: -     Sulfamethoxazole-Trimethoprim; Take 1 tablet by mouth 2 (two) times daily for 7 days.  Dispense: 14 tablet; Refill: 0  Vision changes Assessment & Plan: Worry for retinal detachment, pt advised and recommended to go to ER however pt declines.  Urgent referral ophthalmologist placed.   Orders: -     Ambulatory referral to Ophthalmology  SOB (shortness of breath) Assessment & Plan: Using daily albuterol did advise pt to f/u with pulmonary for this as she does use this daily with anoro. May need additional maintenance medication.       Follow up plan: No follow-ups on file.  Eugenia Pancoast, FNP

## 2022-12-07 NOTE — Assessment & Plan Note (Signed)
poct urine dip in office Urine culture ordered pending results antbx sent to pharmacy, pt to take as directed. Encouraged increased water intake throughout the day. Choosing to treat due to being symptomatic. If no improvement in the next 2 days pt advised to let me know.  

## 2022-12-08 ENCOUNTER — Encounter: Payer: Self-pay | Admitting: *Deleted

## 2022-12-09 NOTE — Progress Notes (Signed)
Pending susceptibility report

## 2022-12-10 ENCOUNTER — Telehealth: Payer: Self-pay | Admitting: Family

## 2022-12-10 LAB — URINE CULTURE
MICRO NUMBER:: 14651034
SPECIMEN QUALITY:: ADEQUATE

## 2022-12-10 NOTE — Telephone Encounter (Signed)
Pt called returning Shannon's missed call regarding lab results. Told pt Dugal's response. Pt had no questions/concerns. Call back # [phone]

## 2022-12-10 NOTE — Telephone Encounter (Signed)
Noted, thanks!

## 2023-01-10 ENCOUNTER — Ambulatory Visit: Payer: 59 | Admitting: Student in an Organized Health Care Education/Training Program

## 2023-02-08 ENCOUNTER — Ambulatory Visit: Payer: 59

## 2023-02-17 ENCOUNTER — Ambulatory Visit: Payer: 59 | Admitting: Student in an Organized Health Care Education/Training Program

## 2023-02-17 ENCOUNTER — Telehealth: Payer: Self-pay | Admitting: Student in an Organized Health Care Education/Training Program

## 2023-02-17 DIAGNOSIS — R0602 Shortness of breath: Secondary | ICD-10-CM

## 2023-02-17 MED ORDER — ALBUTEROL SULFATE HFA 108 (90 BASE) MCG/ACT IN AERS: INHALATION_SPRAY | RESPIRATORY_TRACT | 5 refills | Status: DC

## 2023-02-17 MED ORDER — ANORO ELLIPTA 62.5-25 MCG/ACT IN AEPB: 1.0000 | INHALATION_SPRAY | Freq: Every day | RESPIRATORY_TRACT | 5 refills | Status: DC

## 2023-02-17 NOTE — Telephone Encounter (Signed)
I have sent in a refill of the Albuterol inhaler. The Anoro inhaler was originally sent in with 12 refills so she should still have refills at her pharmacy. I tried to contact the patient. Someone answered the phone and then hung up. I will try again later.

## 2023-02-17 NOTE — Telephone Encounter (Signed)
PT needs Anoro and Albuterol refills. Had to cancel appt today due to issues w/Brother has Alzheimer's and she is having a hard time and called EMT's this AM.  [phone] is her number  [facility] in [place]

## 2023-02-17 NOTE — Telephone Encounter (Signed)
I notified the patient. She said when she looks at her prescription information online it shows that she does not have any refills of the Anoro. I have sent in a refill on the Anoro for her.   Nothing further needed.

## 2023-03-29 ENCOUNTER — Encounter: Payer: Self-pay | Admitting: Family

## 2023-03-29 ENCOUNTER — Other Ambulatory Visit: Payer: Self-pay | Admitting: Family

## 2023-03-29 DIAGNOSIS — Z720 Tobacco use: Secondary | ICD-10-CM

## 2023-05-12 ENCOUNTER — Telehealth: Payer: Self-pay

## 2023-05-12 NOTE — Patient Outreach (Signed)
  Care Coordination   Initial Visit Note   05/12/2023 Name: Natalie Haynes MRN: [id number] DOB: Oct 03, 1951  Natalie Haynes is a 72 y.o. year old female who sees Mort Sawyers, FNP for primary care. I spoke with  [name] by phone today.  What matters to the patients health and wellness today?  Patient states her main need is help with getting her brother into a nursing home.  Patient reports talking with social worker on yesterday regarding these concerns and states she is expecting to hear back from Child psychotherapist on today.  Patient denies any nursing and /or care coordination needs at this time.     Goals Addressed             This Visit's Progress    Care coordination activities.       Interventions Today    Flowsheet Row Most Recent Value  General Interventions   General Interventions Discussed/Reviewed General Interventions Discussed  [Care coordination services discussed. SDOH survey completed. AWV discussed and patient advised to contact provider office to schedule. Discussed vaccines. Advised to contact PCP office if care coordination services needed in the future.]              SDOH assessments and interventions completed:  Yes  SDOH Interventions Today    Flowsheet Row Most Recent Value  SDOH Interventions   Food Insecurity Interventions Intervention Not Indicated  Housing Interventions Intervention Not Indicated  Transportation Interventions Intervention Not Indicated        Care Coordination Interventions:  Yes, provided   Follow up plan: No further intervention required.   Encounter Outcome:  Pt. Visit Completed   George Ina RN,BSN,CCM [facility] Care Coordination [phone] direct line

## 2023-05-30 ENCOUNTER — Ambulatory Visit (INDEPENDENT_AMBULATORY_CARE_PROVIDER_SITE_OTHER): Payer: 59

## 2023-05-30 VITALS — Ht 61.5 in | Wt 118.0 lb

## 2023-05-30 DIAGNOSIS — Z0000 Encounter for general adult medical examination without abnormal findings: Secondary | ICD-10-CM | POA: Diagnosis not present

## 2023-05-30 NOTE — Progress Notes (Signed)
Subjective:   Natalie Haynes is a 72 y.o. female who presents for Medicare Annual (Subsequent) preventive examination.  Visit Complete: Virtual  I connected with  [name] on 05/30/23 by a audio enabled telemedicine application and verified that I am speaking with the correct person using two identifiers.  Patient Location: Home  Provider Location: Office/Clinic  I discussed the limitations of evaluation and management by telemedicine. The patient expressed understanding and agreed to proceed.  Vital Signs: Because this visit was a virtual/telehealth visit, some criteria may be missing or patient reported. Any vitals not documented were not able to be obtained and vitals that have been documented are patient reported.    Review of Systems     Cardiac Risk Factors include: advanced age (>40men, >45 women);sedentary lifestyle;family history of premature cardiovascular disease;smoking/ tobacco exposure     Objective:    Today's Vitals   05/30/23 1607  Weight: 118 lb (53.5 kg)  Height: 5' 1.5" (1.562 m)  PainSc: 0-No pain   Body mass index is 21.93 kg/m.     05/30/2023    4:10 PM  Advanced Directives  Does Patient Have a Medical Advance Directive? No  Would patient like information on creating a medical advance directive? No - Patient declined    Current Medications (verified) Outpatient Encounter Medications as of 05/30/2023  Medication Sig   albuterol (VENTOLIN HFA) 108 (90 Base) MCG/ACT inhaler INHALE 2 PUFFS BY MOUTH EVERY 6 HOURS AS NEEDED FOR WHEEZE OR SHORTNESS OF BREATH   ANORO ELLIPTA 62.5-25 MCG/ACT AEPB Inhale 1 puff into the lungs daily.   nicotine (NICODERM CQ - DOSED IN MG/24 HOURS) 21 mg/24hr patch PLACE 1 PATCH ONTO THE SKIN DAILY.   nicotine polacrilex (NICOTINE MINI) 4 MG lozenge Take 1 lozenge (4 mg total) by mouth every 2 (two) hours as needed for smoking cessation. (Patient not taking: Reported on 12/07/2022)   No facility-administered encounter  medications on file as of 05/30/2023.    Allergies (verified) Codeine, Ibuprofen, and Penicillins   History: Past Medical History:  Diagnosis Date   Bronchitis    Hearing impaired    Past Surgical History:  Procedure Laterality Date   APPENDECTOMY     CARTILAGE SURGERY     Removed from nose   INNER EAR SURGERY     TUBAL LIGATION     Family History  Problem Relation Age of Onset   Miscarriages / Stillbirths Mother    Hearing loss Mother    Drug abuse Mother    Depression Mother    Alcohol abuse Mother    Brain cancer Mother    Mental illness Mother    Early death Father        killed in [place]   Hearing loss Paternal Grandmother    Stroke Paternal Grandmother    Social History   Socioeconomic History   Marital status: Legally Separated    Spouse name: Not on file   Number of children: Not on file   Years of education: Not on file   Highest education level: Not on file  Occupational History   Occupation: retired  Tobacco Use   Smoking status: Every Day    Current packs/day: 2.00    Average packs/day: 2.0 packs/day for 50.0 years (100.0 ttl pk-yrs)    Types: Cigarettes   Smokeless tobacco: Never  Vaping Use   Vaping status: Never Used  Substance and Sexual Activity   Alcohol use: Not Currently   Drug use: Not Currently  Types: Marijuana, LSD    Comment: in the 70's not since   Sexual activity: Yes    Partners: Male    Birth control/protection: Surgical  Other Topics Concern   Not on file  Social History Narrative   Not on file   Social Determinants of Health   Financial Resource Strain: Low Risk  (05/30/2023)   Overall Financial Resource Strain (CARDIA)    Difficulty of Paying Living Expenses: Not hard at all  Food Insecurity: No Food Insecurity (05/30/2023)   Hunger Vital Sign    Worried About Running Out of Food in the Last Year: Never true    Ran Out of Food in the Last Year: Never true  Transportation Needs: No Transportation Needs  (05/30/2023)   PRAPARE - Administrator, Civil Service (Medical): No    Lack of Transportation (Non-Medical): No  Physical Activity: Inactive (05/30/2023)   Exercise Vital Sign    Days of Exercise per Week: 0 days    Minutes of Exercise per Session: 0 min  Stress: No Stress Concern Present (05/30/2023)   [employer] of Occupational Health - Occupational Stress Questionnaire    Feeling of Stress : Not at all  Social Connections: Unknown (05/30/2023)   Social Connection and Isolation Panel [NHANES]    Frequency of Communication with Friends and Family: More than three times a week    Frequency of Social Gatherings with Friends and Family: More than three times a week    Attends Religious Services: Patient unable to answer    Active Member of Clubs or Organizations: No    Attends Engineer, structural: Patient unable to answer    Marital Status: Separated    Tobacco Counseling Ready to quit: Not Answered Counseling given: Not Answered   Clinical Intake:  Pre-visit preparation completed: Yes  Pain : No/denies pain Pain Score: 0-No pain     BMI - recorded: 21.93 Nutritional Status: BMI of 19-24  Normal Nutritional Risks: None Diabetes: No  How often do you need to have someone help you when you read instructions, pamphlets, or other written materials from your doctor or pharmacy?: 1 - Never What is the last grade level you completed in school?: 3 YEARS OF COLLEGE  Interpreter Needed?: No  Information entered by :: Berlie Persky N. Raigen Jagielski, LPN.   Activities of Daily Living    05/30/2023    4:14 PM  In your present state of health, do you have any difficulty performing the following activities:  Hearing? 1  Vision? 0  Difficulty concentrating or making decisions? 0  Walking or climbing stairs? 0  Dressing or bathing? 0  Doing errands, shopping? 0  Preparing Food and eating ? N  Using the Toilet? N  In the past six months, have you accidently  leaked urine? N  Do you have problems with loss of bowel control? N  Managing your Medications? N  Managing your Finances? N  Housekeeping or managing your Housekeeping? N    Patient Care Team: Mort Sawyers, FNP as PCP - General (Family Medicine)  Indicate any recent Medical Services you may have received from other than [facility] providers in the past year (date may be approximate).     Assessment:   This is a routine wellness examination for Reynolds.  Hearing/Vision screen Hearing Screening - Comments:: Patient has hearing difficulty and wears hearing aids. Vision Screening - Comments:: Patient does wear corrective lenses/contacts.  Annual eye exam done by: NO CURRENT OPTOMETRIST   Dietary  issues and exercise activities discussed:     Goals Addressed               This Visit's Progress     Patient Stated (pt-stated)        I feel that I am doing 100% better this year with my breathing.  I feel good.      Depression Screen    05/30/2023    4:13 PM  PHQ 2/9 Scores  PHQ - 2 Score 0  PHQ- 9 Score 0    Fall Risk    05/30/2023    4:12 PM 05/05/2022    9:42 AM  Fall Risk   Falls in the past year? 0 1  Number falls in past yr: 0 0  Injury with Fall? 0 0  Risk for fall due to : No Fall Risks No Fall Risks  Follow up Falls prevention discussed     MEDICARE RISK AT HOME: Medicare Risk at Home Any stairs in or around the home?: No If so, are there any without handrails?: No Home free of loose throw rugs in walkways, pet beds, electrical cords, etc?: Yes Adequate lighting in your home to reduce risk of falls?: Yes Life alert?: No Use of a cane, walker or w/c?: No Grab bars in the bathroom?: No (NEED THOSE TO BE INSTALLED) Shower chair or bench in shower?: Yes Elevated toilet seat or a handicapped toilet?: No  TIMED UP AND GO:  Was the test performed?  No    Cognitive Function:        05/30/2023    4:16 PM  6CIT Screen  What Year? 0 points  What month? 0  points  What time? 0 points  Count back from 20 0 points  Months in reverse 0 points  Repeat phrase 0 points  Total Score 0 points    Immunizations  There is no immunization history on file for this patient.  TDAP status: Declined, Education has been provided regarding the importance of this vaccine but patient still declined. Advised may receive this vaccine at local pharmacy or Health Dept. Aware to provide a copy of the vaccination record if obtained from local pharmacy or Health Dept. Verbalized acceptance and understanding.  Flu Vaccine status: Declined, Education has been provided regarding the importance of this vaccine but patient still declined. Advised may receive this vaccine at local pharmacy or Health Dept. Aware to provide a copy of the vaccination record if obtained from local pharmacy or Health Dept. Verbalized acceptance and understanding.  Pneumococcal vaccine status: Declined,  Education has been provided regarding the importance of this vaccine but patient still declined. Advised may receive this vaccine at local pharmacy or Health Dept. Aware to provide a copy of the vaccination record if obtained from local pharmacy or Health Dept. Verbalized acceptance and understanding.   Covid-19 vaccine status: Declined, Education has been provided regarding the importance of this vaccine but patient still declined. Advised may receive this vaccine at local pharmacy or Health Dept.or vaccine clinic. Aware to provide a copy of the vaccination record if obtained from local pharmacy or Health Dept. Verbalized acceptance and understanding.  Qualifies for Shingles Vaccine? Yes   Zostavax completed No   Shingrix Completed?: No.    Education has been provided regarding the importance of this vaccine. Patient has been advised to call insurance company to determine out of pocket expense if they have not yet received this vaccine. Advised may also receive vaccine at local pharmacy or Health  Dept. Verbalized acceptance and understanding.  Screening Tests Health Maintenance  Topic Date Due   Hepatitis C Screening  Never done   DTaP/Tdap/Td (1 - Tdap) Never done   Colonoscopy  Never done   MAMMOGRAM  Never done   Zoster Vaccines- Shingrix (1 of 2) Never done   DEXA SCAN  Never done   COVID-19 Vaccine (1 - 2023-24 season) Never done   INFLUENZA VACCINE  05/05/2023   Pneumonia Vaccine 60+ Years old (1 of 2 - PCV) 11/12/2023 (Originally 10/28/1956)   Lung Cancer Screening  11/24/2023   Medicare Annual Wellness (AWV)  05/29/2024   HPV VACCINES  Aged Out    Health Maintenance  Health Maintenance Due  Topic Date Due   Hepatitis C Screening  Never done   DTaP/Tdap/Td (1 - Tdap) Never done   Colonoscopy  Never done   MAMMOGRAM  Never done   Zoster Vaccines- Shingrix (1 of 2) Never done   DEXA SCAN  Never done   COVID-19 Vaccine (1 - 2023-24 season) Never done   INFLUENZA VACCINE  05/05/2023    Colorectal cancer screening: No longer required.  Patient declined.  Mammogram status: No longer required due to patient declined.  Bone Density status: No longer required due to patient declined.  Lung Cancer Screening: (Low Dose CT Chest recommended if Age 70-80 years, 20 pack-year currently smoking OR have quit w/in 15years.) does qualify.   Lung Cancer Screening Referral: order placed; next appointment is 11/24/2023   Additional Screening:  Hepatitis C Screening: does qualify; Completed: no  Vision Screening: Recommended annual ophthalmology exams for early detection of glaucoma and other disorders of the eye. Is the patient up to date with their annual eye exam?  Yes  Who is the provider or what is the name of the office in which the patient attends annual eye exams? Looking for a new optometrist If pt is not established with a provider, would they like to be referred to a provider to establish care? No .   Dental Screening: Recommended annual dental exams for proper  oral hygiene  Diabetic Foot Exam: N/A  Community Resource Referral / Chronic Care Management: CRR required this visit?  No   CCM required this visit?  No     Plan:     I have personally reviewed and noted the following in the patient's chart:   Medical and social history Use of alcohol, tobacco or illicit drugs  Current medications and supplements including opioid prescriptions. Patient is not currently taking opioid prescriptions. Functional ability and status Nutritional status Physical activity Advanced directives List of other physicians Hospitalizations, surgeries, and ER visits in previous 12 months Vitals Screenings to include cognitive, depression, and falls Referrals and appointments  In addition, I have reviewed and discussed with patient certain preventive protocols, quality metrics, and best practice recommendations. A written personalized care plan for preventive services as well as general preventive health recommendations were provided to patient.     Mickeal Needy, LPN   1/61/0960   After Visit Summary: (Mail) Due to this being a telephonic visit, the after visit summary with patients personalized plan was offered to patient via mail   Nurse Notes: Normal cognitive status assessed by direct observation via telephone conversation by this Nurse Health Advisor. No abnormalities found.

## 2023-05-30 NOTE — Patient Instructions (Signed)
Natalie Haynes , Thank you for taking time to come for your Medicare Wellness Visit. I appreciate your ongoing commitment to your health goals. Please review the following plan we discussed and let me know if I can assist you in the future.   Referrals/Orders/Follow-Ups/Clinician Recommendations: No  This is a list of the screening recommended for you and due dates:  Health Maintenance  Topic Date Due   Hepatitis C Screening  Never done   DTaP/Tdap/Td vaccine (1 - Tdap) Never done   Colon Cancer Screening  Never done   Mammogram  Never done   Zoster (Shingles) Vaccine (1 of 2) Never done   DEXA scan (bone density measurement)  Never done   COVID-19 Vaccine (1 - 2023-24 season) Never done   Flu Shot  05/05/2023   Pneumonia Vaccine (1 of 2 - PCV) 11/12/2023*   Screening for Lung Cancer  11/24/2023   Medicare Annual Wellness Visit  05/29/2024   HPV Vaccine  Aged Out  *Topic was postponed. The date shown is not the original due date.    Advanced directives: (Declined) Advance directive discussed with you today. Even though you declined this today, please call our office should you change your mind, and we can give you the proper paperwork for you to fill out.  Next Medicare Annual Wellness Visit scheduled for next year: No

## 2023-07-07 ENCOUNTER — Other Ambulatory Visit: Payer: Self-pay | Admitting: Family

## 2023-07-07 DIAGNOSIS — R0602 Shortness of breath: Secondary | ICD-10-CM

## 2023-07-07 DIAGNOSIS — J42 Unspecified chronic bronchitis: Secondary | ICD-10-CM

## 2023-07-12 ENCOUNTER — Encounter: Payer: Self-pay | Admitting: Student in an Organized Health Care Education/Training Program

## 2023-07-12 ENCOUNTER — Telehealth: Payer: Self-pay | Admitting: Family

## 2023-07-12 DIAGNOSIS — R0602 Shortness of breath: Secondary | ICD-10-CM

## 2023-07-12 NOTE — Telephone Encounter (Signed)
This medication is filled by [facility]. Pt will need to contact their office for this refill. Spoke with pt and she is aware of this. Nothing further was needed.

## 2023-07-12 NOTE — Telephone Encounter (Signed)
We will need a new PFT order to schedule the PFT

## 2023-07-12 NOTE — Telephone Encounter (Signed)
Prescription Request  07/12/2023  LOV: 12/07/2022  What is the name of the medication or equipment? ANORO ELLIPTA 62.5-25 MCG/ACT AEPB   Have you contacted your pharmacy to request a refill? No   Which pharmacy would you like this sent to?  [id number], [place] - [address] [place] [address] Phone: [phone] Fax: [phone]    Patient notified that their request is being sent to the clinical staff for review and that they should receive a response within 2 business days.   Please advise at Mobile [phone] (mobile)

## 2023-07-12 NOTE — Telephone Encounter (Signed)
New PFT order has been placed.

## 2023-07-13 NOTE — Telephone Encounter (Signed)
I have spoke with Natalie Haynes about her PFT being scheduled on 08/04/23 @ 8:00am at [facility] Entrance. She was asking if she would be able to get her Anoro refilled since she hasn't been seen in a timely manner

## 2023-08-04 ENCOUNTER — Ambulatory Visit: Payer: 59

## 2023-09-05 ENCOUNTER — Other Ambulatory Visit: Payer: Self-pay | Admitting: Student in an Organized Health Care Education/Training Program

## 2023-09-05 DIAGNOSIS — R0602 Shortness of breath: Secondary | ICD-10-CM

## 2023-10-07 ENCOUNTER — Other Ambulatory Visit: Payer: Self-pay | Admitting: Student in an Organized Health Care Education/Training Program

## 2023-10-07 DIAGNOSIS — R0602 Shortness of breath: Secondary | ICD-10-CM

## 2024-03-05 ENCOUNTER — Telehealth: Payer: Self-pay

## 2024-03-05 ENCOUNTER — Other Ambulatory Visit (HOSPITAL_COMMUNITY): Payer: Self-pay

## 2024-03-05 NOTE — Telephone Encounter (Signed)
*  [facility]  Pharmacy Patient Advocate Encounter   Received notification from Fax that prior authorization for Anoro Ellipta  is required/requested.   Insurance verification completed.   The patient is insured through [employer] .   Per test claim:  Brand Anoro is preferred by the insurance.  If suggested medication is appropriate, Please send in a new RX and discontinue this one. If not, please advise as to why it's not appropriate so that we may request a Prior Authorization. Please note, some preferred medications may still require a PA.  If the suggested medications have not been trialed and there are no contraindications to their use, the PA will not be submitted, as it will not be approved.  *patient at this time has not been seen in 1+yr. No PA can be done until patient has been seen.

## 2024-03-07 NOTE — Telephone Encounter (Signed)
 Received another fax- called patient pharmacy and left message to process for Brand Anoro

## 2024-03-28 ENCOUNTER — Ambulatory Visit: Payer: Self-pay

## 2024-03-28 ENCOUNTER — Emergency Department
Admission: EM | Admit: 2024-03-28 | Discharge: 2024-03-29 | Disposition: A | Discharge status: Home / Self Care | Attending: Emergency Medicine | Admitting: Emergency Medicine

## 2024-03-28 ENCOUNTER — Other Ambulatory Visit: Payer: Self-pay

## 2024-03-28 DIAGNOSIS — F17200 Nicotine dependence, unspecified, uncomplicated: Secondary | ICD-10-CM | POA: Diagnosis not present

## 2024-03-28 DIAGNOSIS — R262 Difficulty in walking, not elsewhere classified: Secondary | ICD-10-CM | POA: Diagnosis not present

## 2024-03-28 DIAGNOSIS — M6281 Muscle weakness (generalized): Secondary | ICD-10-CM | POA: Diagnosis not present

## 2024-03-28 DIAGNOSIS — R110 Nausea: Secondary | ICD-10-CM | POA: Diagnosis not present

## 2024-03-28 DIAGNOSIS — R630 Anorexia: Secondary | ICD-10-CM | POA: Diagnosis not present

## 2024-03-28 DIAGNOSIS — F32A Depression, unspecified: Secondary | ICD-10-CM

## 2024-03-28 DIAGNOSIS — N390 Urinary tract infection, site not specified: Secondary | ICD-10-CM | POA: Diagnosis not present

## 2024-03-28 DIAGNOSIS — F322 Major depressive disorder, single episode, severe without psychotic features: Secondary | ICD-10-CM | POA: Insufficient documentation

## 2024-03-28 LAB — CBC WITH DIFFERENTIAL/PLATELET
Abs Immature Granulocytes: 0.03 10*3/uL (ref 0.00–0.07)
Basophils Absolute: 0.1 10*3/uL (ref 0.0–0.1)
Basophils Relative: 1 %
Eosinophils Absolute: 0.2 10*3/uL (ref 0.0–0.5)
Eosinophils Relative: 3 %
HCT: 39.4 % (ref 36.0–46.0)
Hemoglobin: 12.6 g/dL (ref 12.0–15.0)
Immature Granulocytes: 0 %
Lymphocytes Relative: 9 %
Lymphs Abs: 0.8 10*3/uL (ref 0.7–4.0)
MCH: 32.8 pg (ref 26.0–34.0)
MCHC: 32 g/dL (ref 30.0–36.0)
MCV: 102.6 fL — ABNORMAL HIGH (ref 80.0–100.0)
Monocytes Absolute: 0.5 10*3/uL (ref 0.1–1.0)
Monocytes Relative: 6 %
Neutro Abs: 7.1 10*3/uL (ref 1.7–7.7)
Neutrophils Relative %: 81 %
Platelets: 345 10*3/uL (ref 150–400)
RBC: 3.84 MIL/uL — ABNORMAL LOW (ref 3.87–5.11)
RDW: 13.4 % (ref 11.5–15.5)
WBC: 8.7 10*3/uL (ref 4.0–10.5)
nRBC: 0 % (ref 0.0–0.2)

## 2024-03-28 LAB — COMPREHENSIVE METABOLIC PANEL WITH GFR
ALT: 6 U/L (ref 0–44)
AST: 14 U/L — ABNORMAL LOW (ref 15–41)
Albumin: 2.8 g/dL — ABNORMAL LOW (ref 3.5–5.0)
Alkaline Phosphatase: 37 U/L — ABNORMAL LOW (ref 38–126)
Anion gap: 7 (ref 5–15)
BUN: 21 mg/dL (ref 8–23)
CO2: 21 mmol/L — ABNORMAL LOW (ref 22–32)
Calcium: 7.7 mg/dL — ABNORMAL LOW (ref 8.9–10.3)
Chloride: 109 mmol/L (ref 98–111)
Creatinine, Ser: 0.53 mg/dL (ref 0.44–1.00)
GFR, Estimated: >60 mL/min (ref >60)
Glucose, Bld: 76 mg/dL (ref 70–99)
Potassium: 4 mmol/L (ref 3.5–5.1)
Sodium: 137 mmol/L (ref 135–145)
Total Bilirubin: 0.7 mg/dL (ref 0.0–1.2)
Total Protein: 5.7 g/dL — ABNORMAL LOW (ref 6.5–8.1)

## 2024-03-28 LAB — URINALYSIS, ROUTINE W REFLEX MICROSCOPIC
Bilirubin Urine: NEGATIVE
Glucose, UA: NEGATIVE mg/dL
Ketones, ur: 5 mg/dL — AB
Nitrite: NEGATIVE
Protein, ur: 30 mg/dL — AB
RBC / HPF: >50 RBC/hpf (ref 0–5)
Specific Gravity, Urine: 1.015 (ref 1.005–1.030)
WBC, UA: >50 WBC/hpf (ref 0–5)
pH: 5 (ref 5.0–8.0)

## 2024-03-28 LAB — LIPASE, BLOOD: Lipase: 46 U/L (ref 11–51)

## 2024-03-28 MED ORDER — SODIUM CHLORIDE 0.9 % IV SOLN
1.0000 g | Freq: Once | INTRAVENOUS | Status: AC
  Administered 2024-03-28: 1 g via INTRAVENOUS
  Filled 2024-03-28: qty 10

## 2024-03-28 MED ORDER — CEPHALEXIN 500 MG PO CAPS
500.0000 mg | ORAL_CAPSULE | Freq: Three times a day (TID) | ORAL | Status: DC
  Administered 2024-03-29 (×2): 500 mg via ORAL
  Filled 2024-03-28 (×2): qty 1

## 2024-03-28 NOTE — Telephone Encounter (Signed)
 FYI Only or Action Required?: FYI only for provider.  Patient was last seen in primary care on 12/07/2022 by Corwin Antu, FNP. Called Nurse Triage reporting Heat Exposure. Symptoms began several days ago. Interventions attempted: Rest, hydration, or home remedies. Symptoms are: gradually worsening.  Triage Disposition: Call EMS 911 Now  Patient/caregiver understands and will follow disposition?: Yes                             Copied from CRM [id number]. Topic: Clinical - Red Word Triage >> Mar 28, 2024  2:14 PM Donna BRAVO wrote: Red Word that prompted transfer to Nurse Triage: patient calling stating she is on the verge of heat stroke, no AC in trailer, not eating for 5 days, only drinking water, patient is very weak. Reason for Disposition  Very weak (e.g., can't stand)  Answer Assessment - Initial Assessment Questions 1. SYMPTOMS: What symptoms are you concerned about?     Weakness, nausea, rash and losing balance 2. ONSET:  When did the symptoms start?     4-5 days ago  3. FEVER: Do you have a fever? If Yes, ask: What is it, how was it measured, and when did it start?      Denies 4. HEAT EXPOSURE: What caused you to become hot? (e.g., outside in the sun, inside a hot room)     States she has been in her trailer without AC for 4-5 days  7. OTHER SYMPTOMS: Do you have any other symptoms? (e.g., fainting, flushed skin, weakness, nausea, vomiting, muscle cramps)     Lack of appetite, sweating States I can barely stand up and walk, states I am losing my balance and have almost fallen, states the temperature of her trailer is most likely 120-130 degrees, states she has not slept in 5 days    This RN called 911. Patient heading to ED via ambulance.  Protocols used: Heat Exposure (Heat Exhaustion and Heat Stroke)-A-AH

## 2024-03-28 NOTE — Telephone Encounter (Signed)
 Noted

## 2024-03-28 NOTE — BH Assessment (Signed)
 This Clinical research associate contacted [facility] via phone to request an assessment, request has been made, assessment is currently pending

## 2024-03-28 NOTE — Telephone Encounter (Signed)
 Noted and agree with Er recommendation.

## 2024-03-28 NOTE — ED Triage Notes (Addendum)
 BIBEMS, coming from home. Possible heat exhaustion. Pts AC broken @home  for a few days. 2 days ago, c/o ABD pain and decreased appetite. Pt reports poor urinary output. 100.21F temporal. Ice packs placed by EMS. 18G RFA and LFA. 2L of fluids given, last temp was 98.21F. GCS 15. HR initially, now 96. BG: 87. Pt also reports mental health problems at this time, denies SI/HI

## 2024-03-28 NOTE — ED Provider Notes (Signed)
 [facility] Provider Note    Event Date/Time   First MD Initiated Contact with Patient 03/28/24 1529     (approximate)   History   Abdominal Pain   HPI  Natalie Haynes is a 73 y.o. female who presents to the emergency department today via EMS because of concerns for possible heat exhaustion.  The patient states that her [facility] has been out for the past few days.  The temperature has been extremely high.  She states that she has been having nausea and decreased appetite.  She has been trying to keep up with her fluids although had noticed decreased urination.  No significant abdominal pain associated with this decreased appetite.  The patient additionally states that she has been quite depressed for few months since the death of her brother earlier in the year.     Physical Exam   Triage Vital Signs: ED Triage Vitals  Encounter Vitals Group     BP 03/28/24 1530 135/74     Girls Systolic BP Percentile --      Girls Diastolic BP Percentile --      Boys Systolic BP Percentile --      Boys Diastolic BP Percentile --      Pulse Rate 03/28/24 1530 93     Resp 03/28/24 1530 16     Temp 03/28/24 1530 98.4 F (36.9 C)     Temp Source 03/28/24 1530 Oral     SpO2 03/28/24 1530 97 %     Weight --      Height --      Head Circumference --      Peak Flow --      Pain Score 03/28/24 1533 0     Pain Loc --      Pain Education --      Exclude from Growth Chart --     Most recent vital signs: Vitals:   03/28/24 1530  BP: 135/74  Pulse: 93  Resp: 16  Temp: 98.4 F (36.9 C)  SpO2: 97%   General: Awake, alert, oriented. CV:  Good peripheral perfusion. Regular rate and rhythm. Resp:  Normal effort. Lungs clear. Abd:  No distention. Non tender.   ED Results / Procedures / Treatments   Labs (all labs ordered are listed, but only abnormal results are displayed) Labs Reviewed  CBC WITH DIFFERENTIAL/PLATELET - Abnormal; Notable for the following components:       Result Value   RBC 3.84 (*)    MCV 102.6 (*)    All other components within normal limits  URINALYSIS, ROUTINE W REFLEX MICROSCOPIC - Abnormal; Notable for the following components:   Color, Urine YELLOW (*)    APPearance HAZY (*)    Hgb urine dipstick MODERATE (*)    Ketones, ur 5 (*)    Protein, ur 30 (*)    Leukocytes,Ua LARGE (*)    Bacteria, UA RARE (*)    All other components within normal limits  COMPREHENSIVE METABOLIC PANEL WITH GFR - Abnormal; Notable for the following components:   CO2 21 (*)    Calcium 7.7 (*)    Total Protein 5.7 (*)    Albumin 2.8 (*)    AST 14 (*)    Alkaline Phosphatase 37 (*)    All other components within normal limits  LIPASE, BLOOD     EKG  I, Guadalupe Eagles, attending physician, personally viewed and interpreted this EKG  EKG Time: 1536 Rate: 93 Rhythm: normal sinus rhythm  Axis: normal Intervals: qtc 452 QRS: narrow ST changes: no st elevation Impression: normal ekg   RADIOLOGY None   PROCEDURES:  Critical Care performed: No    MEDICATIONS ORDERED IN ED: Medications - No data to display   IMPRESSION / MDM / ASSESSMENT AND PLAN / ED COURSE  I reviewed the triage vital signs and the nursing notes.                              Differential diagnosis includes, but is not limited to, heat injury, dehydration, depression  Patient's presentation is most consistent with acute presentation with potential threat to life or bodily function.   Patient presented to the emergency department today because of concerns for possible heat injury.  Patient has not had air conditioning for the past few days.  On exam patient is awake and alert.  Blood work without concerning AKI.  No leukocytosis.  Urine does have some findings of infection although I have low concern for sepsis at this time.  Patient also discussed some depression since the death of her brother a few months ago.  Will have psychiatry evaluate.  Additionally will  get social work involved considering patient's current living situation.   FINAL CLINICAL IMPRESSION(S) / ED DIAGNOSES   Final diagnoses:  Depression, unspecified depression type  Lower urinary tract infectious disease     Note:  This document was prepared using Dragon voice recognition software and may include unintentional dictation errors.    Floy Roberts, MD 03/28/24 2230

## 2024-03-29 ENCOUNTER — Encounter: Payer: Self-pay | Admitting: Psychiatry

## 2024-03-29 DIAGNOSIS — N390 Urinary tract infection, site not specified: Secondary | ICD-10-CM

## 2024-03-29 DIAGNOSIS — F322 Major depressive disorder, single episode, severe without psychotic features: Secondary | ICD-10-CM | POA: Diagnosis not present

## 2024-03-29 MED ORDER — DIPHENHYDRAMINE HCL 25 MG PO CAPS: 25.0000 mg | ORAL_CAPSULE | Freq: Four times a day (QID) | ORAL | Status: DC | PRN

## 2024-03-29 NOTE — ED Notes (Signed)
 Called [facility] at this time

## 2024-03-29 NOTE — ED Provider Notes (Signed)
 Psychiatry cleared patient. CSW has evaluated patient. Apparently not able to place patient at this time, patient stated she would be okay going home. While I do have concern about sending patient back to her home without any air conditioning the patient is comfortable and states that she would rather be at home at this time. Did encourage patient to call 911 if she felt she was getting too hot again.   Floy Roberts, MD 03/29/24 [phone]

## 2024-03-29 NOTE — ED Notes (Signed)
 Pt verbalizes understanding of discharge instructions. Opportunity for questioning and answers were provided. Pt discharged from ED to home in golden [place] [employer]. MD and SW aware.

## 2024-03-29 NOTE — ED Notes (Signed)
 Patient is in her room without a bed alarm being applied.  Patient is ambulatory without assistance, and is able to live independently.  She is up with a steady gait when she ambulates to the bathroom.
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
## 2024-03-29 NOTE — BH Assessment (Signed)
 Comprehensive Clinical Assessment (CCA) Note  03/29/2024 Natalie Haynes [id number]  Chief Complaint: Patient is a 73 year old female presenting to [facility] ED voluntarily. Per triage note BIBEMS, coming from home. Possible heat exhaustion. Pts AC broken @home  for a few days. 2 days ago, c/o ABD pain and decreased appetite. Pt reports poor urinary output. 100.63F temporal. Ice packs placed by EMS. 18G RFA and LFA. 2L of fluids given, last temp was 98.63F. GCS 15. HR initially, now 96. BG: 87. Pt also reports mental health problems at this time, denies SI/HI. During assessment patient appears alert and oriented x4, calm and cooperative. Patient reports my brother and I were never close, he was in a bad spot so he lived with me but he died and I feel like my brian is frozen and I'm having a lot of negative thoughts. Patient denies being on any current medications and denies wanting to be on any current medications I want to do this naturally without medications, patient is receptive to counseling. Patient is also having housing issues and reports that she needs assistance with taking care of herself. Patient denies SI/HI/AH/VH.  Patient psyc cleared, referred to [facility] for SNF placement Chief Complaint  Patient presents with   Abdominal Pain   Visit Diagnosis: Depression    CCA Screening, Triage and Referral (STR)  Patient Reported Information How did you hear about us ? Self  Referral name: No data recorded Referral phone number: No data recorded  Whom do you see for routine medical problems? No data recorded Practice/Facility Name: No data recorded Practice/Facility Phone Number: No data recorded Name of Contact: No data recorded Contact Number: No data recorded Contact Fax Number: No data recorded Prescriber Name: No data recorded Prescriber Address (if known): No data recorded  What Is the Reason for Your Visit/Call Today? BIBEMS, coming from home. Possible heat exhaustion. Pts AC broken  @home  for a few days. 2 days ago, c/o ABD pain and decreased appetite. Pt reports poor urinary output. 100.63F temporal. Ice packs placed by EMS. 18G RFA and LFA. 2L of fluids given, last temp was 98.63F. GCS 15. HR initially, now 96. BG: 87. Pt also reports mental health problems at this time, denies SI/HI  How Long Has This Been Causing You Problems? > than 6 months  What Do You Feel Would Help You the Most Today? Financial Resources; Housing Assistance   Have You Recently Been in Any Inpatient Treatment (Hospital/Detox/Crisis Center/28-Day Program)? No data recorded Name/Location of Program/Hospital:No data recorded How Long Were You There? No data recorded When Were You Discharged? No data recorded  Have You Ever Received Services From [facility] Before? No data recorded Who Do You See at [facility]? No data recorded  Have You Recently Had Any Thoughts About Hurting Yourself? No  Are You Planning to Commit Suicide/Harm Yourself At This time? No   Have you Recently Had Thoughts About Hurting Someone Sherral? No  Explanation: No data recorded  Have You Used Any Alcohol or Drugs in the Past 24 Hours? No  How Long Ago Did You Use Drugs or Alcohol? No data recorded What Did You Use and How Much? No data recorded  Do You Currently Have a Therapist/Psychiatrist? No  Name of Therapist/Psychiatrist: No data recorded  Have You Been Recently Discharged From Any Office Practice or Programs? No  Explanation of Discharge From Practice/Program: No data recorded    CCA Screening Triage Referral Assessment Type of Contact: Face-to-Face  Is this Initial or Reassessment? No  data recorded Date Telepsych consult ordered in [facility]:  No data recorded Time Telepsych consult ordered in [facility]:  No data recorded  Patient Reported Information Reviewed? No data recorded Patient Left Without Being Seen? No data recorded Reason for Not Completing Assessment: No data recorded  Collateral Involvement: No  data recorded  Does Patient Have a Court Appointed Legal Guardian? No data recorded Name and Contact of Legal Guardian: No data recorded If Minor and Not Living with Parent(s), Who has Custody? No data recorded Is CPS involved or ever been involved? Never  Is APS involved or ever been involved? Never   Patient Determined To Be At Risk for Harm To Self or Others Based on Review of Patient Reported Information or Presenting Complaint? No  Method: No data recorded Availability of Means: No data recorded Intent: No data recorded Notification Required: No data recorded Additional Information for Danger to Others Potential: No data recorded Additional Comments for Danger to Others Potential: No data recorded Are There Guns or Other Weapons in Your Home? No  Types of Guns/Weapons: No data recorded Are These Weapons Safely Secured?                            No data recorded Who Could Verify You Are Able To Have These Secured: No data recorded Do You Have any Outstanding Charges, Pending Court Dates, Parole/Probation? No data recorded Contacted To Inform of Risk of Harm To Self or Others: No data recorded  Location of Assessment: [facility] ED   Does Patient Present under Involuntary Commitment? No  IVC Papers Initial File Date: No data recorded  [place] of Residence: [place]   Patient Currently Receiving the Following Services: No data recorded  Determination of Need: Emergent (2 hours)   Options For Referral: No data recorded    CCA Biopsychosocial Intake/Chief Complaint:  No data recorded Current Symptoms/Problems: No data recorded  Patient Reported Schizophrenia/Schizoaffective Diagnosis in Past: No   Strengths: Patient is able to communicate her needs  Preferences: No data recorded Abilities: No data recorded  Type of Services Patient Feels are Needed: No data recorded  Initial Clinical Notes/Concerns: No data recorded  Mental Health Symptoms Depression:  Change  in energy/activity; Fatigue   Duration of Depressive symptoms: Greater than two weeks   Mania:  None   Anxiety:   None   Psychosis:  None   Duration of Psychotic symptoms: No data recorded  Trauma:  None   Obsessions:  None   Compulsions:  None   Inattention:  None   Hyperactivity/Impulsivity:  None   Oppositional/Defiant Behaviors:  None   Emotional Irregularity:  None   Other Mood/Personality Symptoms:  No data recorded   Mental Status Exam Appearance and self-care  Stature:  Average   Weight:  Average weight   Clothing:  Casual   Grooming:  Normal   Cosmetic use:  None   Posture/gait:  Normal   Motor activity:  Not Remarkable   Sensorium  Attention:  Normal   Concentration:  Normal   Orientation:  X5   Recall/memory:  Normal   Affect and Mood  Affect:  Appropriate   Mood:  Other (Comment)   Relating  Eye contact:  Normal   Facial expression:  Responsive   Attitude toward examiner:  Cooperative   Thought and Language  Speech flow: Clear and Coherent   Thought content:  Appropriate to Mood and Circumstances   Preoccupation:  None  Hallucinations:  None   Organization:  No data recorded  [employer] of Knowledge:  Fair   Intelligence:  Average   Abstraction:  Normal   Judgement:  Good   Reality Testing:  Realistic   Insight:  Good   Decision Making:  Normal   Social Functioning  Social Maturity:  Responsible   Social Judgement:  Normal   Stress  Stressors:  Housing; Office manager Ability:  Normal   Skill Deficits:  None   Supports:  Support needed     Religion: Religion/Spirituality Are You A Religious Person?: No  Leisure/Recreation: Leisure / Recreation Do You Have Hobbies?: No  Exercise/Diet: Exercise/Diet Do You Exercise?: No Have You Gained or Lost A Significant Amount of Weight in the Past Six Months?: No Do You Follow a Special Diet?: No Do You Have Any Trouble Sleeping?:  No   CCA Employment/Education Employment/Work Situation: Employment / Work Situation Employment Situation: Retired Has Patient ever Been in Equities trader?: No  Education: Education Is Patient Currently Attending School?: No Did You Have An Individualized Education Program (IIEP): No Did You Have Any Difficulty At [employer]?: No Patient's Education Has Been Impacted by Current Illness: No   CCA Family/Childhood History Family and Relationship History: Family history Marital status: Single  Childhood History:  Childhood History Did patient suffer any verbal/emotional/physical/sexual abuse as a child?: No Did patient suffer from severe childhood neglect?: No Has patient ever been sexually abused/assaulted/raped as an adolescent or adult?: No Was the patient ever a victim of a crime or a disaster?: No Witnessed domestic violence?: No Has patient been affected by domestic violence as an adult?: No  Child/Adolescent Assessment:     CCA Substance Use Alcohol/Drug Use: Alcohol / Drug Use Pain Medications: see mar Prescriptions: see mar Over the Counter: see mar History of alcohol / drug use?: No history of alcohol / drug abuse                         ASAM's:  Six Dimensions of Multidimensional Assessment  Dimension 1:  Acute Intoxication and/or Withdrawal Potential:      Dimension 2:  Biomedical Conditions and Complications:      Dimension 3:  Emotional, Behavioral, or Cognitive Conditions and Complications:     Dimension 4:  Readiness to Change:     Dimension 5:  Relapse, Continued use, or Continued Problem Potential:     Dimension 6:  Recovery/Living Environment:     ASAM Severity Score:    ASAM Recommended Level of Treatment:     Substance use Disorder (SUD)    Recommendations for Services/Supports/Treatments:    DSM5 Diagnoses: Patient Active Problem List   Diagnosis Date Noted   Current severe episode of major depressive disorder without psychotic  features without prior episode (HCC) 03/29/2024   Lower urinary tract infectious disease 03/29/2024   Vision changes 12/07/2022   Acute cystitis without hematuria 12/07/2022   B12 deficiency 09/01/2022   DOE (dyspnea on exertion) 05/20/2022   Tachycardia 05/20/2022   Abnormal weight loss 05/20/2022   Chronic bronchitis (HCC) 05/05/2022   SOB (shortness of breath) 05/05/2022   Other fatigue 05/05/2022   Tobacco abuse 05/05/2022   Abnormal chest x-ray 05/05/2022   Macular degeneration 05/05/2022   Age-related cataract of both eyes 05/05/2022   HEARING LOSS, BILATERAL 06/28/2008   RENAL CALCULUS 10/04/2001    Patient Centered Plan: Patient is on the following Treatment Plan(s):  Depression  Referrals to Alternative Service(s): Referred to Alternative Service(s):   Place:   Date:   Time:    Referred to Alternative Service(s):   Place:   Date:   Time:    Referred to Alternative Service(s):   Place:   Date:   Time:    Referred to Alternative Service(s):   Place:   Date:   Time:      @BHCOLLABOFCARE @  [employer], LCAS-A

## 2024-03-29 NOTE — ED Notes (Signed)
Assisted patient to the restroom.  

## 2024-03-29 NOTE — ED Notes (Signed)
 Pt ate 50% of meal tray. Fresh water given to pt.

## 2024-03-29 NOTE — ED Notes (Signed)
 Pt ambulated independently to bathroom. Pt reports 1 episode of loose stool. Pt resting in bed at this time.

## 2024-03-29 NOTE — Evaluation (Signed)
 Physical Therapy Evaluation Patient Details Name: Natalie Haynes MRN: [id number] DOB: 08-May-1951 Today's Date: 03/29/2024  History of Present Illness  73 year old patient brought in by EMS coming from home. Possible heat exhaustion. Pts AC broken @home  for a few days. 2 days ago, c/o ABD pain and decreased appetite. Pt reports poor urinary output. 100.82F temporal.  Clinical Impression  Patient received on stretcher in ED, room mostly dark. She is agreeable to PT assessment. Patient is independent with bed mobility, transfers with supervision. She initially ambulated without AD but had 1-2 lob requiring min A to maintain balance. Patient then walked with hand held assist and trialed walker. She is much safer with walker. She reports she does have a walker at home that was her brothers. Patient will continue to benefit from skilled PT to improve strength, endurance and safety.         If plan is discharge home, recommend the following: A little help with walking and/or transfers;A little help with bathing/dressing/bathroom;Assist for transportation;Help with stairs or ramp for entrance   Can travel by private vehicle    yes    Equipment Recommendations None recommended by PT;Other (comment) (states she has walker that was her brothers)  Recommendations for Other Services       Functional Status Assessment Patient has had a recent decline in their functional status and demonstrates the ability to make significant improvements in function in a reasonable and predictable amount of time.     Precautions / Restrictions Precautions Precautions: Fall Recall of Precautions/Restrictions: Intact Restrictions Weight Bearing Restrictions Per Provider Order: No      Mobility  Bed Mobility Overal bed mobility: Independent                  Transfers Overall transfer level: Independent Equipment used: None                    Ambulation/Gait Ambulation/Gait assistance: Min  assist, Contact guard assist Gait Distance (Feet): 150 Feet Assistive device: 1 person hand held assist, Rolling walker (2 wheels)   Gait velocity: WFL     General Gait Details: ambulated initially with cga, some lob/unsteadiness without AD, then hand held assist until walker obtained. Patient much more steady with walker. Fatigued with ambulation requiring seated rest.  Stairs            Wheelchair Mobility     Tilt Bed    Modified Rankin (Stroke Patients Only)       Balance Overall balance assessment: Needs assistance Sitting-balance support: Feet supported Sitting balance-Leahy Scale: Normal     Standing balance support: Single extremity supported, Bilateral upper extremity supported, During functional activity, Reliant on assistive device for balance Standing balance-Leahy Scale: Fair Standing balance comment: unsteady without walker                             Pertinent Vitals/Pain Pain Assessment Pain Assessment: No/denies pain    Home Living Family/patient expects to be discharged to:: Private residence Living Arrangements: Alone Available Help at Discharge: Family;Friend(s);Available PRN/intermittently Type of Home: House Home Access: Stairs to enter   [employer] of Steps: 4/5 with rail   Home Layout: One level Home Equipment: Agricultural consultant (2 wheels);Cane - single point      Prior Function Prior Level of Function : Independent/Modified Independent;Driving             Mobility Comments: uses cane occasionally, has walker  ADLs Comments: independent at baseline     Extremity/Trunk Assessment   Upper Extremity Assessment Upper Extremity Assessment: Overall WFL for tasks assessed    Lower Extremity Assessment Lower Extremity Assessment: Generalized weakness    Cervical / Trunk Assessment Cervical / Trunk Assessment: Normal  Communication   Communication Communication: No apparent difficulties    Cognition  Arousal: Alert Behavior During Therapy: WFL for tasks assessed/performed   PT - Cognitive impairments: No apparent impairments                         Following commands: Intact       Cueing Cueing Techniques: Verbal cues     General Comments      Exercises     Assessment/Plan    PT Assessment Patient needs continued PT services  PT Problem List Decreased strength;Decreased activity tolerance;Decreased balance;Decreased mobility;Decreased knowledge of use of DME       PT Treatment Interventions DME instruction;Gait training;Stair training;Functional mobility training;Therapeutic activities;Therapeutic exercise;Balance training;Cognitive remediation;Patient/family education    PT Goals (Current goals can be found in the Care Plan section)  Acute Rehab PT Goals Patient Stated Goal: return home PT Goal Formulation: With patient Time For Goal Achievement: 04/02/24 Potential to Achieve Goals: Good    Frequency Min 3X/week     Co-evaluation               AM-PAC PT 6 Clicks Mobility  Outcome Measure Help needed turning from your back to your side while in a flat bed without using bedrails?: None Help needed moving from lying on your back to sitting on the side of a flat bed without using bedrails?: None Help needed moving to and from a bed to a chair (including a wheelchair)?: A Little Help needed standing up from a chair using your arms (e.g., wheelchair or bedside chair)?: A Little Help needed to walk in hospital room?: A Little Help needed climbing 3-5 steps with a railing? : A Little 6 Click Score: 20    End of Session   Activity Tolerance: Patient tolerated treatment well;Patient limited by fatigue Patient left: in bed;with call bell/phone within reach Nurse Communication: Mobility status PT Visit Diagnosis: Other abnormalities of gait and mobility (R26.89);Muscle weakness (generalized) (M62.81);Unsteadiness on feet (R26.81);Difficulty in  walking, not elsewhere classified (R26.2)    Time: 8679-8662 PT Time Calculation (min) (ACUTE ONLY): 17 min   Charges:   PT Evaluation $PT Eval Low Complexity: 1 Low PT Treatments $Gait Training: 8-22 mins PT General Charges $$ ACUTE PT VISIT: 1 Visit         Marylene Masek, PT, GCS 03/29/24,2:05 PM

## 2024-03-29 NOTE — Discharge Instructions (Addendum)
 [facility] [address] [place], [place] [address] Phone: [phone]  [facility] [place] [address] [place], [place], [place] [address] Phone: [phone]  When facing a heat wave in [place], [place], seniors have several options for staying cool and safe, including both air conditioning assistance programs and cooling centers.  1. Air Conditioning Assistance Programs: Operation [employer] Relief: This program, run by the [place]  Department of Health and Human Services ([place]CDHHS), provides fans and limited air conditioners to eligible adults at risk for heat-related illnesses. Eligibility: Individuals aged 47 and older, as well as adults with disabilities, can apply for assistance. How to Apply: You can contact your local area agency on aging or the Division of Aging at [phone] for more information or to apply. Program Period: The program runs from May 1 to October 31 each year. Crisis Intervention Program (CIP): This program assists individuals and families facing a heating or cooling-related crisis, providing a one-time vendor payment to help cover the costs. Eligibility: Determined by meeting specific income guidelines and having a health condition requiring a cooling source. How to Apply: Facilities manager county Department of Social Services (DSS) office for eligibility and application procedures. Weatherization Assistance Program: This program helps low-income residents, including seniors, save energy and reduce utility bills through weatherization services, which can include HVAC repairs or replacements. Eligibility: Based on income guidelines, with priority given to the elderly and disabled. How to Apply: Scientific laboratory technician county Department of Social Services (DSS) office or find information on the [employer].  2. Cooling Centers: Several locations in [place] offer a cool environment for seniors during a heat wave. [facility] [place]  ([facility]): Provides temporary shelter and cooling relief for individuals and families without adequate cooling sources, particularly when temperatures exceed 95 degrees. Address: [address] [place], [place]. Note: [facility]'s Drop-In Center and cafeteria provide water and cooling during the summer months. [facility]: Offers a cool and welcoming environment for seniors to socialize and engage in activities. Address: [address] [place], [place]. Phone: [phone]. [employer]: Provides a cool and comfortable environment for individuals to relax and stay cool. Address: [address], [place], [place]. [employer]: A community center with facilities for events and an outdoor swimming pool, offering a cool place to spend time. Address: [address] [place], [place]. [employer]: Another community center that can serve as a cooling center. Address: [address] [place], [place].  3. Additional Tips for Seniors in [employer] [place]aves: Increase fluid intake: Stay hydrated by drinking plenty of water. Spend time in cool or air-conditioned environments regularly: Seek out air-conditioned spaces like malls, libraries, or community centers. Reduce strenuous activity during the afternoon: Avoid outdoor activities during the hottest parts of the day. Consult a physician: Discuss medications that may affect your body's ability to cool down.  Remember that these are just some of the resources available. For more information or assistance, you can always contact the [employer], [employer] of [place], or [employer] of Social Services (DSS) office.

## 2024-03-29 NOTE — Progress Notes (Addendum)
 2:40pm: CSW spoke with patient regarding her discharge plan. Patient confirms she does not have any air conditioning right now and cannot afford to have it fixed. Patient states she does not have any friends or family she could stay with temporarily. Patient states she is agreeable to understand and understands the risk of doing so. Patient states she needs help with transportation getting home. Patient declines home health services at this time due to having no air conditioning.  CSW informed RN and MD of information.  CSW spoke with Niels of [facility] who states she will speak with her coworkers regarding possible resources to fix patient's air conditioning and will call patient directly if anything becomes available. CSW added agency contact information to patient's AVS.  2pm: PT has recommended patient return home with home health services.   10:20am: CSW received consult for possible SNF placement.  Patient was psychiatrically cleared this morning.  CSW sent secure chat to RN requesting a PT order be placed for evaluation.  Niels Portugal, MSW, LCSW Transitions of Care  Clinical Social Worker II [phone]

## 2024-04-02 ENCOUNTER — Ambulatory Visit: Payer: Self-pay | Admitting: Family

## 2024-04-02 NOTE — Telephone Encounter (Signed)
 FYI Only or Action Required?: FYI only for provider.  Patient was last seen in primary care on 12/07/2022 by Corwin Antu, FNP. Called Nurse Triage reporting Urinary Frequency. Symptoms began a week ago. Interventions attempted: Prescription medications: Antibiotic. Symptoms are: unchanged.  Triage Disposition: See Physician Within 24 Hours  Patient/caregiver understands and will follow disposition?: Yes        Copied from CRM [id number]. Topic: Clinical - Red Word Triage >> Apr 02, 2024  4:13 PM Shereese L wrote: [facility] that prompted transfer to Nurse Triage: Patient has a UTI that hasn't cleared up, went to the ER and wasn't prescribed anything but given something in the hospital Reason for Disposition  [1] Taking antibiotic > 72 hours (3 days) for UTI AND [2] painful urination or frequency is SAME (unchanged, not better)  Answer Assessment - Initial Assessment Questions 1. MAIN SYMPTOM: What is the main symptom you are concerned about? (e.g., painful urination, urine frequency)     Urgency with urination 2. BETTER-SAME-WORSE: Are you getting better, staying the same, or getting worse compared to how you felt at your last visit to the doctor (most recent medical visit)?     Staying the same 3. PAIN: How bad is the pain?  (e.g., Scale 1-10; mild, moderate, or severe)   - MILD (1-3): complains slightly about urination hurting   - MODERATE (4-7): interferes with normal activities     - SEVERE (8-10): excruciating, unwilling or unable to urinate because of the pain      No 4. FEVER: Do you have a fever? If Yes, ask: What is it, how was it measured, and when did it start?     No 5. OTHER SYMPTOMS: Do you have any other symptoms? (e.g., blood in the urine, flank pain, vaginal discharge)     Loose bms and lower back pain; denies blood in urine 6. DIAGNOSIS: When was the UTI diagnosed? By whom? Was it a kidney infection, bladder infection or both?     Diagnosed with UTI  when in hospital last week 7. ANTIBIOTIC: What antibiotic(s) are you taking? How many times per day?     Don't know the name but it is finished  Protocols used: Urinary Tract Infection on Antibiotic Follow-up Call - [facility]

## 2024-04-03 ENCOUNTER — Ambulatory Visit: Payer: Self-pay | Admitting: Family

## 2024-04-03 ENCOUNTER — Emergency Department

## 2024-04-03 ENCOUNTER — Ambulatory Visit (INDEPENDENT_AMBULATORY_CARE_PROVIDER_SITE_OTHER): Admitting: Family

## 2024-04-03 ENCOUNTER — Other Ambulatory Visit: Payer: Self-pay

## 2024-04-03 ENCOUNTER — Encounter: Payer: Self-pay | Admitting: Emergency Medicine

## 2024-04-03 ENCOUNTER — Encounter: Payer: Self-pay | Admitting: Family

## 2024-04-03 ENCOUNTER — Emergency Department
Admission: EM | Admit: 2024-04-03 | Discharge: 2024-04-03 | Disposition: A | Discharge status: Home / Self Care | Attending: Emergency Medicine | Admitting: Emergency Medicine

## 2024-04-03 VITALS — BP 126/70 | HR 120 | Resp 18 | Ht 61.0 in | Wt 88.0 lb

## 2024-04-03 DIAGNOSIS — R634 Abnormal weight loss: Secondary | ICD-10-CM | POA: Diagnosis not present

## 2024-04-03 DIAGNOSIS — R3915 Urgency of urination: Secondary | ICD-10-CM | POA: Insufficient documentation

## 2024-04-03 DIAGNOSIS — R0609 Other forms of dyspnea: Secondary | ICD-10-CM

## 2024-04-03 DIAGNOSIS — R531 Weakness: Secondary | ICD-10-CM

## 2024-04-03 DIAGNOSIS — R0602 Shortness of breath: Secondary | ICD-10-CM

## 2024-04-03 DIAGNOSIS — E860 Dehydration: Secondary | ICD-10-CM | POA: Diagnosis not present

## 2024-04-03 DIAGNOSIS — R109 Unspecified abdominal pain: Secondary | ICD-10-CM | POA: Insufficient documentation

## 2024-04-03 DIAGNOSIS — N83201 Unspecified ovarian cyst, right side: Secondary | ICD-10-CM | POA: Insufficient documentation

## 2024-04-03 DIAGNOSIS — Z720 Tobacco use: Secondary | ICD-10-CM

## 2024-04-03 DIAGNOSIS — R159 Full incontinence of feces: Secondary | ICD-10-CM | POA: Diagnosis not present

## 2024-04-03 DIAGNOSIS — J449 Chronic obstructive pulmonary disease, unspecified: Secondary | ICD-10-CM | POA: Insufficient documentation

## 2024-04-03 DIAGNOSIS — R1030 Lower abdominal pain, unspecified: Secondary | ICD-10-CM | POA: Diagnosis present

## 2024-04-03 DIAGNOSIS — R110 Nausea: Secondary | ICD-10-CM | POA: Insufficient documentation

## 2024-04-03 DIAGNOSIS — R000 Tachycardia, unspecified: Secondary | ICD-10-CM

## 2024-04-03 LAB — COMPREHENSIVE METABOLIC PANEL WITH GFR
ALT: 10 U/L (ref 0–44)
AST: 19 U/L (ref 15–41)
Albumin: 3.3 g/dL — ABNORMAL LOW (ref 3.5–5.0)
Alkaline Phosphatase: 36 U/L — ABNORMAL LOW (ref 38–126)
Anion gap: 11 (ref 5–15)
BUN: 17 mg/dL (ref 8–23)
CO2: 21 mmol/L — ABNORMAL LOW (ref 22–32)
Calcium: 8 mg/dL — ABNORMAL LOW (ref 8.9–10.3)
Chloride: 111 mmol/L (ref 98–111)
Creatinine, Ser: 0.65 mg/dL (ref 0.44–1.00)
GFR, Estimated: >60 mL/min (ref >60)
Glucose, Bld: 97 mg/dL (ref 70–99)
Potassium: 3 mmol/L — ABNORMAL LOW (ref 3.5–5.1)
Sodium: 143 mmol/L (ref 135–145)
Total Bilirubin: 0.5 mg/dL (ref 0.0–1.2)
Total Protein: 6.1 g/dL — ABNORMAL LOW (ref 6.5–8.1)

## 2024-04-03 LAB — CBC
HCT: 40.5 % (ref 36.0–46.0)
Hemoglobin: 13.1 g/dL (ref 12.0–15.0)
MCH: 32.4 pg (ref 26.0–34.0)
MCHC: 32.3 g/dL (ref 30.0–36.0)
MCV: 100.2 fL — ABNORMAL HIGH (ref 80.0–100.0)
Platelets: 421 10*3/uL — ABNORMAL HIGH (ref 150–400)
RBC: 4.04 MIL/uL (ref 3.87–5.11)
RDW: 13.8 % (ref 11.5–15.5)
WBC: 6.4 10*3/uL (ref 4.0–10.5)
nRBC: 0 % (ref 0.0–0.2)

## 2024-04-03 LAB — URINALYSIS, ROUTINE W REFLEX MICROSCOPIC
Glucose, UA: NEGATIVE mg/dL
Ketones, ur: 20 mg/dL — AB
Nitrite: NEGATIVE
Protein, ur: 30 mg/dL — AB
RBC / HPF: >50 RBC/hpf (ref 0–5)
Specific Gravity, Urine: 1.039 — ABNORMAL HIGH (ref 1.005–1.030)
pH: 5 (ref 5.0–8.0)

## 2024-04-03 MED ORDER — SODIUM CHLORIDE 0.9 % IV SOLN
1.0000 g | Freq: Once | INTRAVENOUS | Status: AC
  Administered 2024-04-03: 1 g via INTRAVENOUS
  Filled 2024-04-03: qty 10

## 2024-04-03 MED ORDER — IOHEXOL 300 MG/ML  SOLN
60.0000 mL | Freq: Once | INTRAMUSCULAR | Status: AC | PRN
  Administered 2024-04-03: 60 mL via INTRAVENOUS

## 2024-04-03 MED ORDER — OMEPRAZOLE 20 MG PO CPDR: 20.0000 mg | DELAYED_RELEASE_CAPSULE | Freq: Every day | ORAL | 3 refills | Status: DC

## 2024-04-03 MED ORDER — SODIUM CHLORIDE 0.9 % IV BOLUS
1000.0000 mL | Freq: Once | INTRAVENOUS | Status: AC
  Administered 2024-04-03: 1000 mL via INTRAVENOUS

## 2024-04-03 NOTE — Assessment & Plan Note (Signed)
 EKG in office with tachycardia and ectopic beats with sinus rhythm  Pt with worsening DOE.  Pt stable to not go by EMS however did advised of 911 precautions. She states she is going to drive herself directly to the ER.  Suspect dehydration however can not rule out with DOE Heart failure or other acute process

## 2024-04-03 NOTE — Assessment & Plan Note (Addendum)
 Worsening On physical exam left upper and lower lobe with decreased breath sounds.  Would need stat XRAY possible CT chest to r/o pneumonia, heart failure, neoplasm, pneumonia Advised to go to ER for progression of symptoms in a short time frame Upon discharge will also need to f/u with pulmonary.  Needs to pick up and restart anoro

## 2024-04-03 NOTE — Assessment & Plan Note (Signed)
 Worry for mass CT abd pelvis to consider if not completed in hospital/ER.  Will also consider referral to gastroenterologist for further eval Will discuss at ER f/u appt

## 2024-04-03 NOTE — Discharge Instructions (Signed)
 Please drink plenty of fluids and obtain plenty of rest.  Please call the number provided for gynecology they will see you in the office as a follow-up regarding your right ovarian cyst.  Return to the emergency department for any symptom personally concerning to yourself.

## 2024-04-03 NOTE — Patient Instructions (Addendum)
  Call pulmonary to get in as you are overdue for an appointment.  Pick up your anoro  I am ordering an ultrasound of your heart, an echocardiogram.  You should get a phone call to schedule in the next one week or two.

## 2024-04-03 NOTE — Assessment & Plan Note (Signed)
 Will need urinalysis with culture repeat to r/o UTI /treatment failure.

## 2024-04-03 NOTE — ED Triage Notes (Signed)
 Patient to eD via [facility] from home for possible dehydration. PT had weakness, diarrhea and poor po intake for the past week. Seen for same last week. EMS report no AC at home.   20 L forearm- given 500mL NS

## 2024-04-03 NOTE — ED Provider Notes (Signed)
 [facility] Provider Note    Event Date/Time   First MD Initiated Contact with Patient 04/03/24 1606     (approximate)  History   Chief Complaint: Weakness  HPI  Natalie Haynes is a 73 y.o. female with a medical history of COPD, bronchitis, presents to the emergency department with multiple complaints.  According to the patient approximately 1 week ago she was seen in the emergency department for weakness ultimately diagnosed with a urinary tract infection received antibiotics in the emergency department but the patient states she was not discharged on any antibiotics.  Patient states she was able to contact her doctor who called her in an antibiotic this morning which she just picked up and started taking today.  Patient states she has been experiencing some very minimal lower abdominal discomfort at times, states some shortness of breath but states this is chronic due to her COPD.  Patient also describes generalized fatigue weakness over the past 1 week or so.  Patient also states for the past few months she has been losing a significant amount of weight.  She has not been weighing herself, but feels like she has been losing weight.  No cancer history.  Physical Exam   Triage Vital Signs: ED Triage Vitals  Encounter Vitals Group     BP 04/03/24 1227 (!) 140/88     Girls Systolic BP Percentile --      Girls Diastolic BP Percentile --      Boys Systolic BP Percentile --      Boys Diastolic BP Percentile --      Pulse Rate 04/03/24 1227 (!) 110     Resp 04/03/24 1227 17     Temp 04/03/24 1227 98.4 F (36.9 C)     Temp Source 04/03/24 1227 Oral     SpO2 04/03/24 1227 94 %     Weight 04/03/24 1228 88 lb (39.9 kg)     Height 04/03/24 1228 5' 1.5 (1.562 m)     Head Circumference --      Peak Flow --      Pain Score 04/03/24 1228 0     Pain Loc --      Pain Education --      Exclude from Growth Chart --     Most recent vital signs: Vitals:   04/03/24  1227 04/03/24 1536  BP: (!) 140/88 124/78  Pulse: (!) 110 78  Resp: 17 17  Temp: 98.4 F (36.9 C) 97.7 F (36.5 C)  SpO2: 94% 96%    General: Awake, no distress.  CV:  Good peripheral perfusion.  Regular rate and rhythm  Resp:  Normal effort.  Equal breath sounds bilaterally.  Abd:  No distention.  Soft, nontender.  ED Results / Procedures / Treatments   EKG  EKG viewed and interpreted by myself shows sinus tachycardia 108 bpm with a narrow QRS, normal axis, normal intervals, no concerning ST changes.  RADIOLOGY  I have reviewed interpret the chest x-ray images.  Hyperinflation but no consolidation on my evaluation. Radiology is read the x-ray as negative   MEDICATIONS ORDERED IN ED: Medications  sodium chloride  0.9 % bolus 1,000 mL (has no administration in time range)     IMPRESSION / MDM / ASSESSMENT AND PLAN / ED COURSE  I reviewed the triage vital signs and the nursing notes.  Patient's presentation is most consistent with acute presentation with potential threat to life or bodily function.  Patient presents the emergency department  for multiple complaints.  First off patient continues have some darker urine and was diagnosed with UTI last week but has not been on antibiotics until this morning.  Urinalysis shows greater than 50 red cells with some white cells as well as bacteria likely consistent with urinary tract infection.  Patient states she has been experiencing loose stool at times and describes significant weight loss.  Patient feels like she could be dehydrated.  Lab work overall reassuring no significant dehydration anion gap of 11, reassuring CBC with a normal white blood cell count.  However given the patient's weight loss we will obtain a chest x-ray as well as a CT scan of the abdomen and pelvis to evaluate both weight loss as well as continued hematuria and diarrhea.  We will IV hydrate while awaiting these results.  We will dose IV Rocephin  to expedite her  urinary tract infection treatment.  We will also send a urine culture.  Patient's workup shows mild urinary tract infection however the patient states she just picked up her prescription for antibiotics this morning.  Patient's chemistry shows no significant finding.  CBC reassuring with a normal white blood cell count.  Patient CT scan however has resulted showing a large 10.6 cm cyst to the right adnexa.  I spoke to Dr. Lovetta, he states he will see the patient in the office.  He recommended that we get an ultrasound as well as a Roma panel while the patient is in the emergency department.  Both of which have been performed.  Ultrasound shows a 10.4 cm simple appearing cyst likely ovarian in etiology, possibly concerning for cystic neoplasm.  Patient to follow-up with gynecology.  FINAL CLINICAL IMPRESSION(S) / ED DIAGNOSES   Weakness Urinary tract infection Dehydration   Note:  This document was prepared using Dragon voice recognition software and may include unintentional dictation errors.   Dorothyann Drivers, MD 04/03/24 2308

## 2024-04-03 NOTE — Telephone Encounter (Signed)
Agree with precautions given to pt  Agree with nurse assessment in plan.  Thank you for speaking with them. Will see her as scheduled

## 2024-04-03 NOTE — ED Notes (Signed)
 Pt ambulating around ER room 7 independently with steady gait

## 2024-04-03 NOTE — Assessment & Plan Note (Signed)
 Concern for neoplasm. Suggest CT abd pelvis however sending to ER so will await discharge if this is not assessed.

## 2024-04-03 NOTE — Progress Notes (Signed)
 9+  Established Patient Office Visit  Subjective:   Patient ID: Natalie Haynes, female    DOB: 29-Mar-1951  Age: 73 y.o. MRN: [id number]  CC:  Chief Complaint  Patient presents with   Urinary Tract Infection    HPI: Natalie Haynes is a 73 y.o. female presenting on 04/03/2024 for Urinary Tract Infection  Went to Er 6/25 via EMS for concerns for heat exhaustion and symptoms of nausea, decreased appetite, decreased urination. She states that she was at home and it was exteremely hot and her [facility] had been out at her home. Urine showed  Pulse was 93.  Social work was consulted due to pts current living situation. She did have some improvement after one bag IVF.  Urinalysis was with blood in urine, ketones, protein, and leuks. Cbc without acute findings.  She was discharged for keflex  500 mg every 8 hours x one day. She states state the urgency got better for a few days but still there to a certain extent.   Last metabolic panel Lab Results  Component Value Date   GLUCOSE 76 03/28/2024   NA 137 03/28/2024   K 4.0 03/28/2024   CL 109 03/28/2024   CO2 21 (L) 03/28/2024   BUN 21 03/28/2024   CREATININE 0.53 03/28/2024   GFRNONAA >60 03/28/2024   CALCIUM 7.7 (L) 03/28/2024   PROT 5.7 (L) 03/28/2024   ALBUMIN 2.8 (L) 03/28/2024   BILITOT 0.7 03/28/2024   ALKPHOS 37 (L) 03/28/2024   AST 14 (L) 03/28/2024   ALT 6 03/28/2024   ANIONGAP 7 03/28/2024   Acute concerns:   Her AC is still out at home. She does have a portable AC coming in today that she will get hooked up and her son got her a portable AC fan.  That she is going to have set up.   She does state she was consulted by Child psychotherapist and she was given options for getting help locally. She states she doesn't know who to call to get any help she doesn't quite remember what details they gave her to get set up.   She does report bowel issues, she has incontinence. She doesn't have full control and she will have a bowel movement before  she can get to the bathroom. This am she had a bowel movement and didn't even know she had went until she got there, decreased sensation with bowel movement. She has 'absolutely no appetite' all food seems to make her nauseous.   She has had urinary urgency over the last two weeks.  She notes increased weakness and can't get energy, exhausted all of the time and this has been going on for > 6 months.   She does report unexplained weight loss. Has lost 32 pounds in the last five days per report however she states they did not weight her in the hospital they just asked what her normal weight and height were. She does have noted weight loss from 8/24 of 20 pounds. She does report increased gas pains as well in her stomach.  Wt Readings from Last 3 Encounters:  04/03/24 88 lb (39.9 kg)  03/29/24 120 lb (54.4 kg)  05/30/23 118 lb (53.5 kg)    She does have a pain in the right lower back and after lying down for a few hours she feels like she has a rock sitting on her back and she has to move to feel a little better. It feels deep, improved with movement. It does come and  go. And is not sharp and stabbing, dull hard pain. This has been going on for the last few months. She does have a stone from 2024 chest CT with stone in the left kidney 3 mm diameter without hydronephrosis.   She did recently lose her brother which has been hard on her.   Wt Readings from Last 3 Encounters:  04/03/24 88 lb (39.9 kg)  03/29/24 120 lb (54.4 kg)  05/30/23 118 lb (53.5 kg)   CT chest 2024 with emphysematous changes in the lungs. Aortic atherosclerosis. Calcified granuloma right upper lung.   Breathing has worsened as well in the last few weeks since the 'heat hit' she is using her albuterol  every 4 hours rather than every six. Harder to breath with lying down. She has been out of her anora. She does see a pulmonologist however is overdue for an appt. Per note he did order a CT chest but pt states she was not aware so  this has not been scheduled.  She was given nicotine  patches but she did not stop. She has worsening DOE.        ROS: Negative unless specifically indicated above in HPI.   Relevant past medical history reviewed and updated as indicated.   Allergies and medications reviewed and updated.   Current Outpatient Medications:    albuterol  (VENTOLIN  HFA) 108 (90 Base) MCG/ACT inhaler, INHALE 2 PUFFS BY MOUTH EVERY 6 HOURS AS NEEDED FOR WHEEZE OR SHORTNESS OF BREATH, Disp: 54 each, Rfl: 3   ANORO ELLIPTA  62.5-25 MCG/ACT AEPB, TAKE 1 PUFF BY MOUTH EVERY DAY, Disp: 60 each, Rfl: 5  Allergies  Allergen Reactions   Codeine     REACTION: nausea, abd cramps   Ibuprofen     REACTION: antiphylactic shock   Penicillins     REACTION: rash    Objective:   BP 126/70   Pulse (!) 120   Resp 18   Ht 5' 1 (1.549 m)   Wt 88 lb (39.9 kg)   SpO2 95%   BMI 16.63 kg/m    Physical Exam Constitutional:      General: She is not in acute distress.    Appearance: Normal appearance. She is normal weight. She is not ill-appearing, toxic-appearing or diaphoretic.  HENT:     Head: Normocephalic.   Cardiovascular:     Rate and Rhythm: Tachycardia present.  Pulmonary:     Effort: Pulmonary effort is normal. No accessory muscle usage or respiratory distress.     Breath sounds: Decreased air movement and transmitted upper airway sounds present. No stridor. Examination of the left-upper field reveals decreased breath sounds. Examination of the left-lower field reveals decreased breath sounds. Decreased breath sounds present. No wheezing, rhonchi or rales.  Abdominal:     General: Abdomen is flat.     Tenderness: There is no abdominal tenderness. There is no right CVA tenderness or left CVA tenderness.   Musculoskeletal:        General: Normal range of motion.   Neurological:     General: No focal deficit present.     Mental Status: She is alert and oriented to person, place, and time. Mental status  is at baseline.     Motor: No weakness.   Psychiatric:        Mood and Affect: Mood normal.        Behavior: Behavior normal.        Thought Content: Thought content normal.        Judgment:  Judgment normal.     Assessment & Plan:  Nausea  Abnormal weight loss Assessment & Plan: Concern for neoplasm. Suggest CT abd pelvis however sending to ER so will await discharge if this is not assessed.    Right flank pain  Full incontinence of feces Assessment & Plan: Worry for mass CT abd pelvis to consider if not completed in hospital/ER.  Will also consider referral to gastroenterologist for further eval Will discuss at ER f/u appt   Urinary urgency Assessment & Plan: Will need urinalysis with culture repeat to r/o UTI /treatment failure.    DOE (dyspnea on exertion)  Tachycardia Assessment & Plan: EKG in office with tachycardia and ectopic beats with sinus rhythm  Pt with worsening DOE.  Pt stable to not go by EMS however did advised of 911 precautions. She states she is going to drive herself directly to the ER.  Suspect dehydration however can not rule out with DOE Heart failure or other acute process   Orders: -     EKG 12-Lead  SOB (shortness of breath) Assessment & Plan: Worsening On physical exam left upper and lower lobe with decreased breath sounds.  Would need stat XRAY possible CT chest to r/o pneumonia, heart failure, neoplasm, pneumonia Advised to go to ER for progression of symptoms in a short time frame Upon discharge will also need to f/u with pulmonary.  Needs to pick up and restart anoro    Tobacco abuse Assessment & Plan: Smoking cessation instruction/counseling given:  counseled patient on the dangers of tobacco use, advised patient to stop smoking, and reviewed strategies to maximize success       Follow up plan: Return in about 2 weeks (around 04/17/2024) for follow up incontinence .  Ginger Patrick, FNP

## 2024-04-03 NOTE — Assessment & Plan Note (Signed)
 Smoking cessation instruction/counseling given:  counseled patient on the dangers of tobacco use, advised patient to stop smoking, and reviewed strategies to maximize success

## 2024-04-04 NOTE — Telephone Encounter (Signed)
 Copied from CRM [id number]. Topic: General - Other >> Apr 04, 2024  3:43 PM Burnard DEL wrote: Reason for CRM: Patient called in to let Tabitha know that she called a  facility in [place] called [facility] and  they stated that if she was in a hospital ,she could be transferred to them from the hospital. She stated tat she has an appointment with gynecologist at [facility] at [address],for the cyst that she has that is really big and she dont know if they are going to send her to the hospital. She just wanted tabitha to have this information in case she needs it.

## 2024-04-05 ENCOUNTER — Encounter: Payer: Self-pay | Admitting: Family

## 2024-04-05 DIAGNOSIS — K869 Disease of pancreas, unspecified: Secondary | ICD-10-CM | POA: Insufficient documentation

## 2024-04-05 DIAGNOSIS — I714 Abdominal aortic aneurysm, without rupture, unspecified: Secondary | ICD-10-CM | POA: Insufficient documentation

## 2024-04-05 DIAGNOSIS — N9489 Other specified conditions associated with female genital organs and menstrual cycle: Secondary | ICD-10-CM | POA: Insufficient documentation

## 2024-04-05 LAB — PREMENOPAUSAL INTERP: HIGH

## 2024-04-05 LAB — OVARIAN MALIGNANCY RISK-ROMA
Cancer Antigen (CA) 125: 17.9 U/mL (ref 0.0–38.1)
HE4: 80.1 pmol/L (ref 0.0–96.9)
Postmenopausal ROMA: 1.95
Premenopausal ROMA: 2 — ABNORMAL HIGH

## 2024-04-05 LAB — POSTMENOPAUSAL INTERP: LOW

## 2024-04-06 LAB — URINE CULTURE: Culture: 50000 — AB

## 2024-04-07 NOTE — Progress Notes (Signed)
 ED Antimicrobial Stewardship Positive Culture Follow Up   Natalie Haynes is an 73 y.o. female who presented to [facility] on 04/03/2024 with a chief complaint of  Chief Complaint  Patient presents with   Weakness    Recent Results (from the past 720 hours)  Urine Culture     Status: Abnormal   Collection Time: 04/03/24  3:36 PM   Specimen: Urine, Clean Catch  Result Value Ref Range Status   Specimen Description   Final    URINE, CLEAN CATCH Performed at [facility], [address], [place], [place] [address]    Special Requests   Final    NONE Performed at [facility], [address] [place], [place] [address]    Culture 50,000 COLONIES/mL PSEUDOMONAS AERUGINOSA (A)  Final   Report Status 04/06/2024 FINAL  Final   Organism ID, Bacteria PSEUDOMONAS AERUGINOSA (A)  Final      Susceptibility   Pseudomonas aeruginosa - MIC*    CEFTAZIDIME 4 SENSITIVE Sensitive     CIPROFLOXACIN <=0.25 SENSITIVE Sensitive     GENTAMICIN 4 SENSITIVE Sensitive     IMIPENEM 1 SENSITIVE Sensitive     PIP/TAZO 16 SENSITIVE Sensitive ug/mL    CEFEPIME 4 SENSITIVE Sensitive     * 50,000 COLONIES/mL PSEUDOMONAS AERUGINOSA     [x]  Patient discharged originally without antimicrobial agent and treatment is now indicated  New antibiotic prescription: Ciprofloxacin 750 mg po BID x 3 days  ED Provider: Alm ONEIDA Dixons  04/07/24:  called pt @[phone] and preferred pharmacy is [facility] [phone].  Called in above antibiotic Rx.     Allean Haas PharmD Clinical Pharmacist 04/07/2024

## 2024-04-09 ENCOUNTER — Telehealth: Payer: Self-pay

## 2024-04-09 NOTE — Telephone Encounter (Signed)
 Copied from CRM [id number]. Topic: General - Other >> Apr 09, 2024  9:15 AM Essie A wrote: Reason for CRM: Patient would like a return call from Dr. Carlo nurse regarding a follow up from gyn doctor regarding a colonoscopy.  Also she wanted to let her know that she started on her antibiotics.  Please call her at [phone].

## 2024-04-09 NOTE — Telephone Encounter (Signed)
 Spoke with patient, scheduled her follow up appt per her request to discuss everything with Tabitha.

## 2024-04-12 ENCOUNTER — Ambulatory Visit: Admitting: Family

## 2024-04-12 ENCOUNTER — Encounter: Payer: Self-pay | Admitting: *Deleted

## 2024-04-12 VITALS — BP 110/60 | HR 108 | Temp 97.8°F | Ht 61.0 in | Wt 89.4 lb

## 2024-04-12 DIAGNOSIS — Z59819 Housing instability, housed unspecified: Secondary | ICD-10-CM | POA: Insufficient documentation

## 2024-04-12 DIAGNOSIS — R634 Abnormal weight loss: Secondary | ICD-10-CM | POA: Diagnosis not present

## 2024-04-12 DIAGNOSIS — R0609 Other forms of dyspnea: Secondary | ICD-10-CM

## 2024-04-12 DIAGNOSIS — R1012 Left upper quadrant pain: Secondary | ICD-10-CM | POA: Diagnosis not present

## 2024-04-12 DIAGNOSIS — H6123 Impacted cerumen, bilateral: Secondary | ICD-10-CM | POA: Diagnosis not present

## 2024-04-12 DIAGNOSIS — N9489 Other specified conditions associated with female genital organs and menstrual cycle: Secondary | ICD-10-CM

## 2024-04-12 DIAGNOSIS — R531 Weakness: Secondary | ICD-10-CM | POA: Diagnosis not present

## 2024-04-12 DIAGNOSIS — Z1159 Encounter for screening for other viral diseases: Secondary | ICD-10-CM

## 2024-04-12 DIAGNOSIS — D518 Other vitamin B12 deficiency anemias: Secondary | ICD-10-CM | POA: Diagnosis not present

## 2024-04-12 DIAGNOSIS — Z741 Need for assistance with personal care: Secondary | ICD-10-CM | POA: Insufficient documentation

## 2024-04-12 DIAGNOSIS — Z114 Encounter for screening for human immunodeficiency virus [HIV]: Secondary | ICD-10-CM

## 2024-04-12 DIAGNOSIS — R159 Full incontinence of feces: Secondary | ICD-10-CM

## 2024-04-12 DIAGNOSIS — Z599 Problem related to housing and economic circumstances, unspecified: Secondary | ICD-10-CM | POA: Insufficient documentation

## 2024-04-12 DIAGNOSIS — Z720 Tobacco use: Secondary | ICD-10-CM

## 2024-04-12 DIAGNOSIS — F321 Major depressive disorder, single episode, moderate: Secondary | ICD-10-CM

## 2024-04-12 DIAGNOSIS — R198 Other specified symptoms and signs involving the digestive system and abdomen: Secondary | ICD-10-CM

## 2024-04-12 DIAGNOSIS — K869 Disease of pancreas, unspecified: Secondary | ICD-10-CM

## 2024-04-12 DIAGNOSIS — R7989 Other specified abnormal findings of blood chemistry: Secondary | ICD-10-CM

## 2024-04-12 LAB — SEDIMENTATION RATE: Sed Rate: 16 mm/h (ref 0–30)

## 2024-04-12 LAB — CBC WITH DIFFERENTIAL/PLATELET
Basophils Absolute: 0.1 K/uL (ref 0.0–0.1)
Basophils Relative: 0.9 % (ref 0.0–3.0)
Eosinophils Absolute: 0.3 K/uL (ref 0.0–0.7)
Eosinophils Relative: 4 % (ref 0.0–5.0)
HCT: 40.6 % (ref 36.0–46.0)
Hemoglobin: 13.3 g/dL (ref 12.0–15.0)
Lymphocytes Relative: 8.8 % — ABNORMAL LOW (ref 12.0–46.0)
Lymphs Abs: 0.6 K/uL — ABNORMAL LOW (ref 0.7–4.0)
MCHC: 32.8 g/dL (ref 30.0–36.0)
MCV: 100.1 fl — ABNORMAL HIGH (ref 78.0–100.0)
Monocytes Absolute: 0.3 K/uL (ref 0.1–1.0)
Monocytes Relative: 4.7 % (ref 3.0–12.0)
Neutro Abs: 5.9 K/uL (ref 1.4–7.7)
Neutrophils Relative %: 81.6 % — ABNORMAL HIGH (ref 43.0–77.0)
Platelets: 410 K/uL — ABNORMAL HIGH (ref 150.0–400.0)
RBC: 4.05 Mil/uL (ref 3.87–5.11)
RDW: 14.9 % (ref 11.5–15.5)
WBC: 7.3 K/uL (ref 4.0–10.5)

## 2024-04-12 LAB — COMPREHENSIVE METABOLIC PANEL WITH GFR
ALT: 7 U/L (ref 0–35)
AST: 13 U/L (ref 0–37)
Albumin: 4.1 g/dL (ref 3.5–5.2)
Alkaline Phosphatase: 36 U/L — ABNORMAL LOW (ref 39–117)
BUN: 23 mg/dL (ref 6–23)
CO2: 30 meq/L (ref 19–32)
Calcium: 9 mg/dL (ref 8.4–10.5)
Chloride: 103 meq/L (ref 96–112)
Creatinine, Ser: 0.45 mg/dL (ref 0.40–1.20)
GFR: 96.77 mL/min (ref >60.00)
Glucose, Bld: 82 mg/dL (ref 70–99)
Potassium: 4 meq/L (ref 3.5–5.1)
Sodium: 140 meq/L (ref 135–145)
Total Bilirubin: 0.3 mg/dL (ref 0.2–1.2)
Total Protein: 6.6 g/dL (ref 6.0–8.3)

## 2024-04-12 LAB — LIPASE: Lipase: 51 U/L (ref 11.0–59.0)

## 2024-04-12 LAB — TSH: TSH: 1.08 u[IU]/mL (ref 0.35–5.50)

## 2024-04-12 LAB — VITAMIN B12: Vitamin B-12: 397 pg/mL (ref 211–911)

## 2024-04-12 LAB — C-REACTIVE PROTEIN: CRP: <1 mg/dL (ref 0.5–20.0)

## 2024-04-12 LAB — T3, FREE: T3, Free: 2.9 pg/mL (ref 2.3–4.2)

## 2024-04-12 LAB — T4, FREE: Free T4: 0.67 ng/dL (ref 0.60–1.60)

## 2024-04-12 LAB — BRAIN NATRIURETIC PEPTIDE: Pro B Natriuretic peptide (BNP): 31 pg/mL (ref 0.0–100.0)

## 2024-04-12 LAB — AMYLASE: Amylase: 50 U/L (ref 27–131)

## 2024-04-12 MED ORDER — MIRTAZAPINE 7.5 MG PO TABS
7.5000 mg | ORAL_TABLET | Freq: Every day | ORAL | 0 refills | Status: DC
Start: 2024-04-12 — End: 2024-05-08

## 2024-04-12 NOTE — Assessment & Plan Note (Signed)
 Several concerns with housing stability , financial difficulties, decreases strength resulting in inability to appropriately care for or groom herself, lack of AC resulting in difficulty breathing as with COPD and this exacerbates symptoms and increases risk for dehydration. Poor living conditions as she reports bags of trash in her home that she doesn't have the ability to carry outside herself. Food instability, could benefit from food stamps.  Welfare check already performed with police dept.  Referral placed for social worker.

## 2024-04-12 NOTE — Assessment & Plan Note (Signed)
 Continue high cal high protein diet continue with protein shakes  Ct abd pelvis reviewed. To complete weight loss lab workup will order HIV hepatitis panel thyroid panel sed rate crp and LDH pending results.  Referral placed to gi for further eval as well , reviewed CT abd from ER with inflammation sigmoid colon and pt with diarrhea ongoing so evaluation needed as well.  Consider ordering calproectectin

## 2024-04-12 NOTE — Progress Notes (Signed)
 Established Patient Office Visit  Subjective:      CC:  Chief Complaint  Patient presents with   Follow-up    Patient requested follow up from GYN appt; patient needs surgery but patients gyn does not believe she is healthy enough for surgery.    Ear Fullness    Patient states she can not hear good out of left ear and feels like it is blocked.    HPI: Natalie Haynes is a 73 y.o. female presenting on 04/12/2024 for Follow-up (Patient requested follow up from GYN appt; patient needs surgery but patients gyn does not believe she is healthy enough for surgery. ) and Ear Fullness (Patient states she can not hear good out of left ear and feels like it is blocked.)  Pt went to ER last week 7/1 after seeing me due to symptoms, went for weakness, abn weight loss, and increasing sob. She had already been on medications for a recent dx of a UTI. EKG with sinus tachy 108 bpm, CXR hyperinflation no consolidation noted. Cbc normal white count. CT scan abd pelvis for evaluation of sbn weight loss was performed and findings were a large 10.6 cm cyst to the right adnexa as well as a small 7 mm hypodense lesion in the pancreases, tiny hypodensity in right hepatic lobe, vascular calcifications at renal hila, wall thickening versus non-distension of sigmoid, 3.7 cm infrarenal abdominal aortic aneurysm, aortic atherosclerosis. U/s was then performed and 10.4 cm simle appearing cyst, ovarian, concerning for cystic neoplasm. Per radiology report repeat MRI MRCP in 2 years recommended for pancreatic hypodense lesion in the pancreases, and surveillance u/s in three years for AAA.   She then saw gynecology, Dr. Debby Crouch Schermerhorn, July 3rd and was advised that she would benefit from laparoscopic BSO as she is at risk for torsion per note, however was withheld from proceeding due to concern for her overall health placing her at higher risk for complications. Per their note benign characteristics noted with right  ovarian cyst.  Dosed with IV rocephin .   New complaints: Pt does still c/o diarrhea however over the last two days has had a solid bowel movement for the first time in two weeks. She has never had a colonoscopy or cologuard per her report.   She reports ongoing weakness and worsening Doe. She states worse with walking and wears out even doing light activities  She does state still very tired and worse with the heat, and this has resulted in her not being able to take care of things. She states she doesn't have hot water in her home, and she has to take cold showers and she isn't washing her hair as often as she needs to. She is having to use a cup to pour water over her hair because she can't bend over to clean her hair. She also has bags of trash in her home because she doesn't have the energy to get them out of her home, too heavy and tiring. She states she can't even get to her windows because of the trash bags. She has a tankless water heater but doesn't have anybody to install it for her, commodes will not flush unless she plunges them. She does state that she had police come to her home last week for wellness check and policemen said that they might be able to find help to get the trash bags out of the home.   Left ear feels blocked, full and full of ear wax. Already hearing  impaired and this is not helping, makes her hearing worse.   COPD, she does see pulmonology but has not seen them since feb 2024, overdue for appt. She is taking her anoro ellipta  1 puff. She does find she is having to use her albuterol  twice a day to help her breath.    Social history:  Relevant past medical, surgical, family and social history reviewed and updated as indicated. Interim medical history since our last visit reviewed.  Allergies and medications reviewed and updated.  DATA REVIEWED: CHART IN EPIC     ROS: Negative unless specifically indicated above in HPI.    Current Outpatient Medications:     albuterol  (VENTOLIN  HFA) 108 (90 Base) MCG/ACT inhaler, INHALE 2 PUFFS BY MOUTH EVERY 6 HOURS AS NEEDED FOR WHEEZE OR SHORTNESS OF BREATH, Disp: 54 each, Rfl: 3   ANORO ELLIPTA  62.5-25 MCG/ACT AEPB, TAKE 1 PUFF BY MOUTH EVERY DAY, Disp: 60 each, Rfl: 5   mirtazapine  (REMERON ) 7.5 MG tablet, Take 1 tablet (7.5 mg total) by mouth at bedtime., Disp: 90 tablet, Rfl: 0      Objective:    BP 110/60   Pulse (!) 108   Temp 97.8 F (36.6 C) (Oral)   Ht 5' 1 (1.549 m)   Wt 89 lb 6.4 oz (40.6 kg)   SpO2 94%   BMI 16.89 kg/m   Wt Readings from Last 3 Encounters:  04/12/24 89 lb 6.4 oz (40.6 kg)  04/03/24 88 lb (39.9 kg)  04/03/24 88 lb (39.9 kg)    Physical Exam Constitutional:      General: She is not in acute distress.    Appearance: Normal appearance. She is normal weight. She is not ill-appearing, toxic-appearing or diaphoretic.  HENT:     Head: Normocephalic.     Right Ear: Hearing, tympanic membrane and external ear normal. There is impacted cerumen.     Left Ear: Hearing, tympanic membrane and external ear normal. There is impacted cerumen.  Cardiovascular:     Rate and Rhythm: Normal rate and regular rhythm.  Pulmonary:     Effort: Pulmonary effort is normal.     Breath sounds: Normal breath sounds.  Abdominal:     General: Bowel sounds are normal.     Palpations: Abdomen is soft.     Tenderness: There is abdominal tenderness in the left upper quadrant. There is no guarding.  Musculoskeletal:        General: Normal range of motion.  Neurological:     General: No focal deficit present.     Mental Status: She is alert and oriented to person, place, and time. Mental status is at baseline.  Psychiatric:        Mood and Affect: Mood normal.        Behavior: Behavior normal.        Thought Content: Thought content normal.        Judgment: Judgment normal.           Assessment & Plan:  Abnormal weight loss Assessment & Plan: Continue high cal high protein diet  continue with protein shakes  Ct abd pelvis reviewed. To complete weight loss lab workup will order HIV hepatitis panel thyroid panel sed rate crp and LDH pending results.  Referral placed to gi for further eval as well , reviewed CT abd from ER with inflammation sigmoid colon and pt with diarrhea ongoing so evaluation needed as well.  Consider ordering calproectectin  Orders: -     Ambulatory  referral to Gastroenterology -     T3, free -     T4, free -     TSH -     Hepatitis panel, acute -     HIV Antibody (routine testing w rflx) -     Sedimentation rate -     C-reactive protein -     Lactate dehydrogenase  Full incontinence of feces Assessment & Plan: Referral to GI today   Orders: -     Ambulatory referral to Gastroenterology  Alternating constipation and diarrhea -     Ambulatory referral to Gastroenterology  DOE (dyspnea on exertion) Assessment & Plan: Largely related to copd  However will order echocardiogram to rule out structural abn Bnp ordered today to rule out CHF exacerbation  Orders: -     Brain natriuretic peptide -     ECHOCARDIOGRAM COMPLETE; Future  Generalized weakness  Depression, major, single episode, moderate (HCC) -     Mirtazapine ; Take 1 tablet (7.5 mg total) by mouth at bedtime.  Dispense: 90 tablet; Refill: 0  Elevated platelet count  Low calcium levels -     Comprehensive metabolic panel with GFR  Other vitamin B12 deficiency anemia -     CBC with Differential/Platelet -     Vitamin B12  Housing instability Assessment & Plan: Several concerns with housing stability , financial difficulties, decreases strength resulting in inability to appropriately care for or groom herself, lack of AC resulting in difficulty breathing as with COPD and this exacerbates symptoms and increases risk for dehydration. Poor living conditions as she reports bags of trash in her home that she doesn't have the ability to carry outside herself. Food  instability, could benefit from food stamps.  Welfare check already performed with police dept.  Referral placed for social worker.    Orders: -     AMB Referral [facility] Care Management  Encounter for hepatitis C screening test for low risk patient -     Hepatitis panel, acute  Screening for HIV (human immunodeficiency virus) -     HIV Antibody (routine testing w rflx)  Financial difficulties -     AMB Referral [facility] Care Management  Assistance needed for grooming -     AMB Referral [facility] Care Management  Self-care deficit for bathing and hygiene -     AMB Referral [facility] Care Management  Left upper quadrant abdominal pain -     Amylase -     Lipase  Tobacco abuse Assessment & Plan: Smoking cessation instruction/counseling given:  counseled patient on the dangers of tobacco use, advised patient to stop smoking, and reviewed strategies to maximize success pt going to try cold [place] but if she is unable to go forward with this she is open to nicotine  patches.    Pelvic cyst in female Assessment & Plan: Cont f/u with gynecology  Workup for abn weight loss in place pending  Referral to GI placed as well     Pancreatic lesion Assessment & Plan: Will follow, MRI MRCP every two years  Due again 04/2026 Amylase lipase ordered for today pending results.    Impacted cerumen of both ears Assessment & Plan: Ceruminosis is noted.  Obtained verbal patient consent prior to procedure, possible risks of procedure discussed with pt prior, and then Wax was removed by syringing/irrigation and manual debridement was performed by me with curette. Instructions for home care to prevent wax buildup are given and handout provided to pt .pt tolerated procedure well.  Return in about 1 month (around 05/13/2024).  Ginger Patrick, MSN, APRN, FNP-C [facility]

## 2024-04-12 NOTE — Assessment & Plan Note (Signed)
Ceruminosis is noted.  Obtained verbal patient consent prior to procedure, possible risks of procedure discussed with pt prior, and then Wax was removed by syringing/irrigation and manual debridement was performed by me with curette. Instructions for home care to prevent wax buildup are given and handout provided to pt .pt tolerated procedure well.   

## 2024-04-12 NOTE — Assessment & Plan Note (Signed)
 Largely related to copd  However will order echocardiogram to rule out structural abn Bnp ordered today to rule out CHF exacerbation

## 2024-04-12 NOTE — Assessment & Plan Note (Signed)
 Cont f/u with gynecology  Workup for abn weight loss in place pending  Referral to GI placed as well

## 2024-04-12 NOTE — Patient Instructions (Addendum)
  A referral was placed today for a colonoscopy and for a GI doctor for your abnormal weight loss and fecal incontinence. I have also referred you to social work to see if we can get you some help.   Please let us  know if you have not heard back within 2 weeks about the referral.  Trial start mirtazapine  let's see if this stimulates your appetite. Take this at night time as it can make you feel tired.   I am also ordering an ultrasound of your heart, called an echocardiogram, due to your worsening shortness of breath.   Call the pulmonologist to schedule a follow up appointment.

## 2024-04-12 NOTE — Assessment & Plan Note (Signed)
Referral to GI today

## 2024-04-12 NOTE — Assessment & Plan Note (Signed)
 Smoking cessation instruction/counseling given:  counseled patient on the dangers of tobacco use, advised patient to stop smoking, and reviewed strategies to maximize success pt going to try cold [place] but if she is unable to go forward with this she is open to nicotine  patches.

## 2024-04-12 NOTE — Assessment & Plan Note (Addendum)
 Will follow, MRI MRCP every two years  Due again 04/2026 Amylase lipase ordered for today pending results.

## 2024-04-13 LAB — LACTATE DEHYDROGENASE: LDH: 144 U/L (ref 120–250)

## 2024-04-13 LAB — HEPATITIS PANEL, ACUTE
Hep A IgM: NONREACTIVE
Hep B C IgM: NONREACTIVE
Hepatitis B Surface Ag: NONREACTIVE
Hepatitis C Ab: NONREACTIVE

## 2024-04-13 LAB — HIV ANTIBODY (ROUTINE TESTING W REFLEX): HIV 1&2 Ab, 4th Generation: NONREACTIVE

## 2024-04-16 ENCOUNTER — Telehealth: Payer: Self-pay

## 2024-04-16 NOTE — Progress Notes (Signed)
 Complex Care Management Note  Care Guide Note 04/16/2024 Name: Natalie Haynes MRN: [id number] DOB: [birth year]  Natalie Haynes is a 73 y.o. year old female who sees Corwin Antu, FNP for primary care. I reached out to [employer] by phone today to offer complex care management services.  [name] was given information about Complex Care Management services today including:   The Complex Care Management services include support from the care team which includes your Nurse Care Manager, Clinical Social Worker, or Pharmacist.  The Complex Care Management team is here to help remove barriers to the health concerns and goals most important to you. Complex Care Management services are voluntary, and the patient may decline or stop services at any time by request to their care team member.   Complex Care Management Consent Status: Patient agreed to services and verbal consent obtained.   Follow up plan:  Telephone appointment with complex care management team member scheduled for:  04/20/24 at 3:00 p.m.   Encounter Outcome:  Patient Scheduled  Dreama [facility]  [facility], [facility] Health Care Management Assistant Direct Dial: [phone]  Fax: [phone]

## 2024-04-16 NOTE — Progress Notes (Signed)
 Complex Care Management Note Care Guide Note  04/16/2024 Name: Natalie Haynes MRN: [id number] DOB: [birth year]   Complex Care Management Outreach Attempts: An unsuccessful telephone outreach was attempted today to offer the patient information about available complex care management services.  Follow Up Plan:  Additional outreach attempts will be made to offer the patient complex care management information and services.   Encounter Outcome:  No Answer  Dreama [facility]  [facility], [facility] Health Care Management Assistant Direct Dial: [phone]  Fax: [phone]

## 2024-04-17 ENCOUNTER — Ambulatory Visit: Payer: Self-pay | Admitting: Family

## 2024-04-17 DIAGNOSIS — R931 Abnormal findings on diagnostic imaging of heart and coronary circulation: Secondary | ICD-10-CM

## 2024-04-17 DIAGNOSIS — I5189 Other ill-defined heart diseases: Secondary | ICD-10-CM

## 2024-04-20 ENCOUNTER — Other Ambulatory Visit: Payer: Self-pay | Admitting: *Deleted

## 2024-04-20 ENCOUNTER — Telehealth: Payer: Self-pay | Admitting: Family

## 2024-04-20 DIAGNOSIS — Z59819 Housing instability, housed unspecified: Secondary | ICD-10-CM

## 2024-04-20 DIAGNOSIS — F321 Major depressive disorder, single episode, moderate: Secondary | ICD-10-CM

## 2024-04-20 DIAGNOSIS — R531 Weakness: Secondary | ICD-10-CM

## 2024-04-20 DIAGNOSIS — Z599 Problem related to housing and economic circumstances, unspecified: Secondary | ICD-10-CM

## 2024-04-20 DIAGNOSIS — Z741 Need for assistance with personal care: Secondary | ICD-10-CM

## 2024-04-20 DIAGNOSIS — R0602 Shortness of breath: Secondary | ICD-10-CM

## 2024-04-20 NOTE — Telephone Encounter (Signed)
 Copied from CRM [id number]. Topic: Referral - Status >> Apr 12, 2024  2:31 PM Donna BRAVO wrote: Reason for CRM: April Care Navigator with [facility]  [phone] ex [phone]  Patient okayed to speak with April  conversation with provider about in home health care and that it is needed.  Patient is wanting to know if the referral has been approved and when will help will be coming into her house.    ----------------------------------------------------------------------- From previous Reason for Contact - Other: Reason for CRM:

## 2024-04-20 NOTE — Telephone Encounter (Signed)
 This is related to a [facility] Care Management Referral (Population health referral)  They have tried to reach the patient and there has been no answer. They did leave a VM for the patient to call back.    Unsuccessful outreach to schedule per SDOH needs. Left confidential voicemail for patient to return call.   Will make additional attempts.   Dreama [facility]wood [facility]  [facility], [facility] Health Care Management Assistant Direct Dial: [phone]  Fax: [phone]

## 2024-04-23 ENCOUNTER — Telehealth: Payer: Self-pay

## 2024-04-23 DIAGNOSIS — R3915 Urgency of urination: Secondary | ICD-10-CM

## 2024-04-23 NOTE — Telephone Encounter (Signed)
 Ok sounds like most of the issues are getting taken care of.   In regards to the echocardiogram who do we mention this to to help her get the echo ordered? this wouldn't be me. If you could forward to whom might be able to help.   If she is able let's repeat a urine culture,  order in. I know she had a urine infection in the hospital we will make sure that has resolved.

## 2024-04-23 NOTE — Patient Outreach (Signed)
 Complex Care Management   Visit Note  04/23/2024  Name:  Natalie Haynes MRN: [id number] DOB: 03-09-1951  Situation: Referral received for Complex Care Management related to SDOH Barriers:  Housing instability Lack of essential utilities no AC or hot water Depression Financial Resource Strain I obtained verbal consent from Patient.  Visit completed with patient  on the phone o 04/30/34  Background:   Past Medical History:  Diagnosis Date   Bronchitis    Hearing impaired     Assessment: Patient Reported Symptoms:  Cognitive Cognitive Status: Alert and oriented to person, place, and time      Neurological Neurological Review of Symptoms: No symptoms reported    HEENT HEENT Symptoms Reported: Change or loss of hearing (needs a new hearing aid) HEENT Management Strategies: Routine screening, Medical device HEENT Self-Management Outcome: 3 (uncertain)    Cardiovascular Cardiovascular Symptoms Reported: Palpitations (with excertion) Does patient have uncontrolled Hypertension?: No Cardiovascular Comment: per patient, echocardiogram will be done  Respiratory Respiratory Symptoms Reported: Shortness of breath Additional Respiratory Details: COPD Respiratory Self-Management Outcome: 4 (good)  Endocrine Endocrine Symptoms Reported: No symptoms reported Is patient diabetic?: No    Gastrointestinal Gastrointestinal Symptoms Reported: Constipation Gastrointestinal Comment: endoscopy and colonoscopy    Genitourinary Genitourinary Symptoms Reported: No symptoms reported    Integumentary Integumentary Symptoms Reported: No symptoms reported    Musculoskeletal Musculoskelatal Symptoms Reviewed: No symptoms reported        Psychosocial Psychosocial Symptoms Reported: Depression - if selected complete PHQ 2-9 Additional Psychological Details: grief symptons related to  loss of brother, financial strain-no AC or hot water  increased fatigue/sadness. difficulty completing ADL's and  IADl's Behavioral Management Strategies: Adequate rest, Coping strategies Behaviors When Feeling Stressed/Fearful: trying to stay busy, keep mind occupied on other things Techniques to Cope with Loss/Stress/Change: Diversional activities, Medication Quality of Family Relationships: non-existent Do you feel physically threatened by others?: No      04/20/2024    3:52 PM  Depression screen PHQ 2/9  Decreased Interest 2  Down, Depressed, Hopeless 3  PHQ - 2 Score 5  Altered sleeping 1  Tired, decreased energy 2  Change in appetite 1  Feeling bad or failure about yourself  2  Trouble concentrating 0  Moving slowly or fidgety/restless 0  Suicidal thoughts 0  PHQ-9 Score 11      04/20/2024    4:07 PM  GAD 7 : Generalized Anxiety Score  Nervous, Anxious, on Edge 1  Control/stop worrying 0  Worry too much - different things 1  Trouble relaxing 1  Restless 1  Easily annoyed or irritable 0  Afraid - awful might happen 0  Total GAD 7 Score 4  Anxiety Difficulty Somewhat difficult     There were no vitals filed for this visit.  Medications Reviewed Today     Reviewed by Ermalinda Lenn HERO, LCSW (Social Worker) on 04/23/24 at 2247  Med List Status: <None>   Medication Order Taking? Sig Documenting Provider Last Dose Status Informant  albuterol  (VENTOLIN  HFA) 108 (90 Base) MCG/ACT inhaler [id number] Yes INHALE 2 PUFFS BY MOUTH EVERY 6 HOURS AS NEEDED FOR WHEEZE OR SHORTNESS OF SHERIDA Isadora Hose, MD  Active Self, Pharmacy Records  ANORO ELLIPTA  62.5-25 MCG/ACT AEPB [id number] Yes TAKE 1 PUFF BY MOUTH EVERY DAY Isadora Hose, MD  Active Self, Pharmacy Records  mirtazapine  (REMERON ) 7.5 MG tablet [id number] Yes Take 1 tablet (7.5 mg total) by mouth at bedtime. Dugal, Tabitha, FNP  Active  Recommendation:   PCP Follow-up Follow up with BSW to assist with community resource needs  Follow Up Plan:   Telephone follow-up 05/17/24   Lenn Mean, LCSW [place]   [facility], [facility] Health Licensed Clinical Social Worker  Direct Dial: [phone]

## 2024-04-23 NOTE — Addendum Note (Signed)
 Addended by: CORWIN ANTU on: 04/23/2024 03:47 PM   Modules accepted: Orders

## 2024-04-23 NOTE — Telephone Encounter (Signed)
 I'm confused by the notes.  Is the first note advising us  that pt asked her navigator from [facility] if the home health care was approved?  And then the second response was in regards to the [facility] of which pt did not return call?

## 2024-04-23 NOTE — Telephone Encounter (Signed)
 Copied from CRM [id number]. Topic: Clinical - Lab/Test Results >> Apr 23, 2024  2:57 PM Viola FALCON wrote: Patient returned  Shannons phone call from 04/18/24 regarding lab results. I asked her the following questions   [name] do you happen to be feeling sick at all?   Patient still having issues with urine and bowels - no appetite. Appetite picked up a little after medication but not enough.    Your white blood cell shift seems to suggest an infection.  Any urinary symptoms? Urgency to urinate    B12 on the lower end of normal you'd probably benefit from some over the counter B12 1000 mcg once daily.  Patient hasn't started b12 yet but she will    Has anyone from the police department and or social workers reached out to you? Yes police department checked in with her to see how she was doing but she hasn't heard from social work yet. She spoke with Chrystal from [facility] Care management and received a call from [facility] care management   Patient also needs help setting up her echocardiogram - order is in. Please call her at [phone] (M)

## 2024-04-23 NOTE — Telephone Encounter (Unsigned)
 Copied from CRM [id number]. Topic: Clinical - Lab/Test Results >> Apr 23, 2024  2:57 PM Viola FALCON wrote: Patient returned  Shannons phone call from 04/18/24 regarding lab results. I asked her the following questions   [name] do you happen to be feeling sick at all?   Patient still having issues with urine and bowels - no appetite. Appetite picked up a little after medication but not enough.    Your white blood cell shift seems to suggest an infection.  Any urinary symptoms? Urgency to urinate    B12 on the lower end of normal you'd probably benefit from some over the counter B12 1000 mcg once daily.  Patient hasn't started b12 yet but she will    Has anyone from the police department and or social workers reached out to you? Yes police department checked in with her to see how she was doing but she hasn't heard from social work yet. She spoke with Chrystal from [facility] Care management and received a call from [facility] care management   Patient also needs help setting up her echocardiogram - order is in. Please call her at [phone] (M)

## 2024-04-23 NOTE — Patient Instructions (Signed)
 Visit Information  Thank you for taking time to visit with me today. Please don't hesitate to contact me if I can be of assistance to you before our next scheduled appointment.  Our next appointment is by telephone on 05/17/24 at 3pm Please call the care guide team at [phone] if you need to cancel or reschedule your appointment.   Following is a copy of your care plan:   Goals Addressed             This Visit's Progress    [facility]BCI Social Work Care Plan       Problems:   Patient has no hot water, no AC, has difficulty completing her own ADl's and IADL's CSW Clinical Goal(s):   Over the next 90 days the Patient will will follow up with the BSW to assist with possible community resources to address utility and rental assistance as well as in home care support  Interventions:  Social Determinants of Health in Patient with Chronic Bronchitis : SDOH assessments completed: Depression  , Corporate treasurer , Geophysicist/field seismologist , Housing , Intimate Partner Violence, and Transportation Evaluation of current treatment plan related to unmet needs Referral made to BSW to assist with community resource needs Emotional support provided related to current life challenges Problem Solving strategies explored (discussed reaching out to natural supports and church family for possible assistance, discussed options for additional support ie Holiday representative, Dept of Social Services-confirmed that patient is currently receiving case management support through [place] and also has a U-card through her insurance )   Patient Goals/Self-Care Activities:  Patient to continue to follow up with available community resources to address financial strain  Plan:   Telephone follow up appointment with care management team member scheduled for:  05/17/24        Please call the Suicide and Crisis Lifeline: 988 if you are experiencing a Mental Health or Behavioral Health Crisis or need someone to talk to.  Patient  verbalizes understanding of instructions and care plan provided today and agrees to view in MyChart. Active MyChart status and patient understanding of how to access instructions and care plan via MyChart confirmed with patient.     Adonus Uselman, LCSW [place]  [facility], [facility] Health Licensed Clinical Social Worker  Direct Dial: [phone]

## 2024-04-25 NOTE — Telephone Encounter (Signed)
 Ok sounds like most of the issues are getting taken care of.    In regards to the echocardiogram who do we mention this to to help her get the echo ordered? this wouldn't be me. If you could forward to whom might be able to help.    If she is able let's repeat a urine culture,  order in. I know she had a urine infection in the hospital we will make sure that has resolved.       Manuelita can you make sure the pt was called back with these results, I sent along to joellen but it doesn't look like she has seen it unless you seen anything in here that suggests otherwise.   Urine and bowels? Does she not have any control at all? Has she heard anything back about the GI referral?

## 2024-04-25 NOTE — Telephone Encounter (Signed)
 LM for pt to returncall

## 2024-04-26 NOTE — Telephone Encounter (Signed)
 Spoke with pt. States that she was made aware of her results; she did not have any questions. Pt knows that she needs to come to repeat a urine culture but she just has not felt up to. Reports that she still has not been contacted to set up the echocardiogram.   Can you guys help with this?

## 2024-04-27 NOTE — Telephone Encounter (Signed)
 It looks like you or the CMA will need to reach out to April Care Navigator with [facility] [phone] ex [phone] to discuss HH/NA needs.  The patient gave verbal consent to speak with [facility]

## 2024-05-01 ENCOUNTER — Telehealth: Payer: Self-pay

## 2024-05-01 NOTE — Progress Notes (Signed)
 Complex Care Management Note  Care Guide Note 05/01/2024 Name: Kirah Stice MRN: [id number] DOB: 1951-07-05  Delois Silvester is a 73 y.o. year old female who sees Corwin Antu, FNP for primary care. I reached out to Heron Fire by phone today to offer complex care management services.  Ms. Valtierra was given information about Complex Care Management services today including:   The Complex Care Management services include support from the care team which includes your Nurse Care Manager, Clinical Social Worker, or Pharmacist.  The Complex Care Management team is here to help remove barriers to the health concerns and goals most important to you. Complex Care Management services are voluntary, and the patient may decline or stop services at any time by request to their care team member.   Complex Care Management Consent Status: Patient agreed to services and verbal consent obtained.   Follow up plan:  Telephone appointment with complex care management team member scheduled for:  05/02/24 at 11:00 a.m.   Encounter Outcome:  Patient Scheduled  Dreama [facility]  [employer], [facility] Health Care Management Assistant Direct Dial: [phone]  Fax: [phone]

## 2024-05-01 NOTE — Telephone Encounter (Signed)
 Is this something we can call Natalie Haynes about and obtain more information?  I can talk to them if needed as well.   Long story short Pt is in a home without AC and it is very hot out which is making it difficult for her with COPD. She doesn't have the capacity to afford an [facility] unit. She also has trash bags in her home that she can not take outside due to SOB and so the house is becoming unhealthy to live in. She can barely afford food.

## 2024-05-02 ENCOUNTER — Telehealth: Payer: Self-pay

## 2024-05-02 ENCOUNTER — Emergency Department
Admission: EM | Admit: 2024-05-02 | Discharge: 2024-05-02 | Disposition: A | Discharge status: Home / Self Care | Attending: Emergency Medicine | Admitting: Emergency Medicine

## 2024-05-02 ENCOUNTER — Ambulatory Visit: Payer: Self-pay

## 2024-05-02 ENCOUNTER — Other Ambulatory Visit: Payer: Self-pay

## 2024-05-02 ENCOUNTER — Emergency Department

## 2024-05-02 ENCOUNTER — Encounter: Payer: Self-pay | Admitting: Emergency Medicine

## 2024-05-02 DIAGNOSIS — K5900 Constipation, unspecified: Secondary | ICD-10-CM | POA: Diagnosis not present

## 2024-05-02 DIAGNOSIS — R109 Unspecified abdominal pain: Secondary | ICD-10-CM | POA: Diagnosis present

## 2024-05-02 LAB — COMPREHENSIVE METABOLIC PANEL WITH GFR
ALT: 8 U/L (ref 0–44)
AST: 14 U/L — ABNORMAL LOW (ref 15–41)
Albumin: 4 g/dL (ref 3.5–5.0)
Alkaline Phosphatase: 39 U/L (ref 38–126)
Anion gap: 12 (ref 5–15)
BUN: 27 mg/dL — ABNORMAL HIGH (ref 8–23)
CO2: 23 mmol/L (ref 22–32)
Calcium: 9.6 mg/dL (ref 8.9–10.3)
Chloride: 107 mmol/L (ref 98–111)
Creatinine, Ser: 0.64 mg/dL (ref 0.44–1.00)
GFR, Estimated: >60 mL/min (ref >60)
Glucose, Bld: 90 mg/dL (ref 70–99)
Potassium: 4.2 mmol/L (ref 3.5–5.1)
Sodium: 142 mmol/L (ref 135–145)
Total Bilirubin: <0.2 mg/dL (ref 0.0–1.2)
Total Protein: 7.1 g/dL (ref 6.5–8.1)

## 2024-05-02 LAB — URINALYSIS, ROUTINE W REFLEX MICROSCOPIC
Bacteria, UA: NONE SEEN
Bilirubin Urine: NEGATIVE
Glucose, UA: NEGATIVE mg/dL
Ketones, ur: 20 mg/dL — AB
Nitrite: NEGATIVE
Protein, ur: 30 mg/dL — AB
Specific Gravity, Urine: 1.034 — ABNORMAL HIGH (ref 1.005–1.030)
pH: 5 (ref 5.0–8.0)

## 2024-05-02 LAB — LIPASE, BLOOD: Lipase: 42 U/L (ref 11–51)

## 2024-05-02 LAB — CBC
HCT: 43.8 % (ref 36.0–46.0)
Hemoglobin: 14.1 g/dL (ref 12.0–15.0)
MCH: 32.6 pg (ref 26.0–34.0)
MCHC: 32.2 g/dL (ref 30.0–36.0)
MCV: 101.4 fL — ABNORMAL HIGH (ref 80.0–100.0)
Platelets: 418 K/uL — ABNORMAL HIGH (ref 150–400)
RBC: 4.32 MIL/uL (ref 3.87–5.11)
RDW: 14.6 % (ref 11.5–15.5)
WBC: 4.8 K/uL (ref 4.0–10.5)
nRBC: 0 % (ref 0.0–0.2)

## 2024-05-02 MED ORDER — DOCUSATE SODIUM 100 MG PO CAPS
100.0000 mg | ORAL_CAPSULE | Freq: Two times a day (BID) | ORAL | 2 refills | Status: AC
Start: 2024-05-02 — End: 2025-05-02

## 2024-05-02 NOTE — ED Provider Notes (Signed)
 [facility] Provider Note    Event Date/Time   First MD Initiated Contact with Patient 05/02/24 1850     (approximate)  History   Chief Complaint: Abdominal Pain  HPI  Natalie Haynes is a 73 y.o. female with a past medical history of bronchitis, presents to the emergency department for constipation and abdominal discomfort.  According to the patient for the past 1 week she has not been able to have a normal bowel movement and states she has been feeling very constipated.  Patient states she has recently followed up with a gastroenterologist who would like to perform a colonoscopy however they state the patient was not healthy enough for this procedure.  Patient states over the past 1 week she has been constipated, states she used a gloved finger and had to manually disimpact some stool a couple days ago and since has been experiencing very small bowel movements each day but states she feels constipated and is experiencing abdominal pain at times.  Patient states the pain comes in waves and then dissipates denies any pain at all currently.  Vital signs are reassuring.  Physical Exam   Triage Vital Signs: ED Triage Vitals  Encounter Vitals Group     BP 05/02/24 1534 123/86     Girls Systolic BP Percentile --      Girls Diastolic BP Percentile --      Boys Systolic BP Percentile --      Boys Diastolic BP Percentile --      Pulse Rate 05/02/24 1534 100     Resp 05/02/24 1534 18     Temp 05/02/24 1534 97.8 F (36.6 C)     Temp Source 05/02/24 1534 Oral     SpO2 05/02/24 1534 98 %     Weight 05/02/24 1533 82 lb (37.2 kg)     Height 05/02/24 1533 5' 1.5 (1.562 m)     Head Circumference --      Peak Flow --      Pain Score 05/02/24 1532 7     Pain Loc --      Pain Education --      Exclude from Growth Chart --     Most recent vital signs: Vitals:   05/02/24 1534  BP: 123/86  Pulse: 100  Resp: 18  Temp: 97.8 F (36.6 C)  SpO2: 98%     General: Awake, no distress.  Quite frail/cachectic in appearance. CV:  Good peripheral perfusion.  Regular rate and rhythm  Resp:  Normal effort.  Equal breath sounds bilaterally.  Abd:  No distention.  Soft, nontender.  No rebound or guarding.  Benign abdomen   ED Results / Procedures / Treatments   RADIOLOGY  I have reviewed and interpreted the x-ray images.  No obvious obstructive pattern seen on my evaluation. Radiology has read as a nonobstructive bowel gas pattern.   MEDICATIONS ORDERED IN ED: Medications - No data to display   IMPRESSION / MDM / ASSESSMENT AND PLAN / ED COURSE  I reviewed the triage vital signs and the nursing notes.  Patient's presentation is most consistent with acute presentation with potential threat to life or bodily function.  Patient presents emergency department for intermittent constipation and abdominal pain.  Overall the patient appears well pain-free currently, benign abdomen.  Patient's lab work is reassuring with a normal CBC normal chemistry normal lipase.  Patient is not on any bowel regimen at home.  We will obtain an x-ray to evaluate for  any obstructive process.  If there is no obstructive process, patient would likely benefit from an enema.  X-ray is reassuring.  Urinalysis shows small amount of ketones otherwise no significant findings besides a very small amount of blood.  We will attempt a soapsuds enema to see if this helps with the patient's constipation issue.  If it does we will discharge with Colace twice daily.  Patient agreeable to plan of care.  Patient had a large bowel movement after enema.  We will discharge home with Colace.  Discussed with the patient to increase her calorie intake as well as fluid intake.  Patient agreeable to plan will follow-up with her doctor  FINAL CLINICAL IMPRESSION(S) / ED DIAGNOSES   Constipation   Note:  This document was prepared using Dragon voice recognition software and may include  unintentional dictation errors.   Dorothyann Drivers, MD 05/02/24 2137

## 2024-05-02 NOTE — ED Notes (Signed)
 Pt taken to lobby by Jeanna, RN after pt stated there was an uber for her. Pt then said she was filling out information for an uber to pick her up. First nurse notified by Jeanna, RN.

## 2024-05-02 NOTE — Telephone Encounter (Signed)
 FYI Only or Action Required?: FYI only for provider.  Patient was last seen in primary care on 04/12/2024 by Corwin Antu, FNP.  Called Nurse Triage reporting Constipation.  Symptoms began a week ago.  Interventions attempted: Other: manual stool removal.  Symptoms are: gradually worsening.  Triage Disposition: Go to ED Now (or PCP Triage)  Patient/caregiver understands and will follow disposition?: Yes  Message from Abigail D sent at 05/02/2024 11:56 AM EDT  Patient wanted to advise that she is down to 83 lbs now. She does not have any appetite, she has a stool blockage that has been there for about a week. Dehydrated and weakness/exhausted she said dehydration is not due to not drinking water. She says she feels like she can hardly get around.    Reason for Disposition  Patient sounds very sick or weak to the triager  Answer Assessment - Initial Assessment Questions 1. STOOL PATTERN OR FREQUENCY: How often do you have a bowel movement (BM)?  (Normal range: 3 times a day to every 3 days)  When was your last BM?      Normally has BM every day, but has not had BM in 7 days. Four days ago, manually removed impacted stool.  States that it was the blackest that she has ever seen 2. STRAINING: Do you have to strain to have a BM?      Normally no difficulty with bowel movements, but a little strain 3. ONSET: When did the constipation begin?     Seven days ago 4. RECTAL PAIN: Does your rectum hurt when the stool comes out? If Yes, ask: Do you have hemorrhoids? How bad is the pain?  (Scale 1-10; or mild, moderate, severe)     Constant rectal pressure 5. BM COMPOSITION: Are the stools hard?      Current stools are hard 6. BLOOD ON STOOLS: Has there been any blood on the toilet tissue or on the surface of the BM? If Yes, ask: When was the last time?     No visible blood, but stool are black 7. CHRONIC CONSTIPATION: Is this a new problem for you?  If No, ask: How long  have you had this problem? (days, weeks, months)      Denies, normally has BM every day 8. CHANGES IN DIET OR HYDRATION: Have there been any recent changes in your diet? How much fluids are you drinking on a daily basis?  How much have you had to drink today?     Decreased appetite 9. MEDICINES: Have you been taking any new medicines? Are you taking any narcotic pain medicines? (e.g., Dilaudid, morphine, Percocet, Vicodin)     Patient was taking antidepressent to increase appetite, but she is concerned that this increased her constipation, so she stopped.  10. LAXATIVES: Have you been using any stool softeners, laxatives, or enemas?  If Yes, ask What are you using, how often, and when was the last time?       Has not tried any laxitives 11. ACTIVITY:  How much walking do you do every day?  Has your activity level decreased in the past week?        Decreased activity 12. CAUSE: What do you think is causing the constipation?        unknown 13. MEDICAL HISTORY: Do you have a history of hemorrhoids, rectal fissures, rectal surgery, or rectal abscess?         Denies history of hemorrhoids or any abdominal symptoms 14. OTHER SYMPTOMS: Do  you have any other symptoms? (e.g., abdomen pain, bloating, fever, vomiting)       Continued weight loss, no weighs 83 pounds, patient states that she is dehydrated, but states that she drinks water and grape juice.  States that nothing tastes good and that she is nauseated.  Has not eaten at time of call(1241) . Reports weakness and feeling unsteady.  Reports that she goes to her call to cool off and that she has no AC in her house 15. PREGNANCY: Is there any chance you are pregnant? When was your last menstrual period?       N/A  Protocols used: Constipation-A-AH

## 2024-05-02 NOTE — ED Triage Notes (Signed)
 Patient to ED via [facility] from home for abd/rectal pain. Ongoing x7 days. States unable to have a BM but passing small amounts of stool. Denies rectal bleeding. Lack of appetite. Saw GI 1.5 weeks ago- has colostomy scheduled.

## 2024-05-02 NOTE — ED Notes (Signed)
 Debby Peach, pts son, unable to pick pt up and stated he cannot drive at night due to his vision.

## 2024-05-02 NOTE — ED Triage Notes (Signed)
 First nurse note: Pt states ABD pain and rectal pain for the past month. Pt being followed by GI to schedule colonoscopy, US  on ABD done 1 week ago. Pt from home.   102/80 HR; 100 96% RA

## 2024-05-03 ENCOUNTER — Other Ambulatory Visit

## 2024-05-03 ENCOUNTER — Telehealth: Payer: Self-pay | Admitting: Student in an Organized Health Care Education/Training Program

## 2024-05-03 DIAGNOSIS — R0602 Shortness of breath: Secondary | ICD-10-CM

## 2024-05-03 MED ORDER — ANORO ELLIPTA 62.5-25 MCG/ACT IN AEPB: 1.0000 | INHALATION_SPRAY | Freq: Every day | RESPIRATORY_TRACT | 0 refills | Status: DC

## 2024-05-03 NOTE — Telephone Encounter (Signed)
 Copied from CRM [id number]. Topic: Clinical - Medication Question >> May 03, 2024  9:56 AM Rozanna MATSU wrote: Reason for CRM: PT CALLED BACK IN REGARDS TO REFILL FOR Anoro. WAS ADVISED ON VM THAT AN APPT WAS AVAILABLE ON 08/1 BUT IT WAS FOR VIDEO AFTER SPEAKING WITH CAL. PT STATED DOES NOT KNOW HOW TO WORK VIDEO. I SCHEUDLED HER FOR 08/18 AND PLACE HER ON THE WAITLIST FOR [facility]. PT WANTS TO KNOW IF SHE CAN GET EMER MED REFIILL. PLEASE REACH OUT TO PT IN REGARDS TO THE MED REFILL

## 2024-05-03 NOTE — Telephone Encounter (Signed)
 Pt needs to be seen by Dgayli or Cobb in order to refill her Anoro. Pt last seen in February 2024

## 2024-05-03 NOTE — Telephone Encounter (Signed)
 NOTED, agree.

## 2024-05-07 ENCOUNTER — Telehealth: Payer: Self-pay

## 2024-05-07 NOTE — Telephone Encounter (Signed)
 NOTED

## 2024-05-07 NOTE — Transitions of Care (Post Inpatient/ED Visit) (Unsigned)
 pt seen at [facility] 05/02/24 pt had no BM 7 days and pt was having rectal pain. no abd pain or vomiting but pt was nauseated on and off. pt had alot of gas. pt able to pass gas.pt received soap suds enema with good results. pt had BM since ED visit. Pt had abd xray showed non obstructive bowel gas pattern. Pt said she has lost a lot of weight. Pt is drinking fluids to try and stay hydrated.pt already has FU appt with T Dugal FNP on 05/08/24 at 10 AM. UC & ED precautions given and pt voiced understanding. Sending note to ONEIDA Patrick FNP.    05/07/2024  Name: Natalie Haynes MRN: [id number] DOB: [birth year]  Today's TOC FU Call Status: Today's TOC FU Call Status:: Successful TOC FU Call Completed TOC FU Call Complete Date: 05/07/24 Patient's Name and Date of Birth confirmed.  Transition Care Management Follow-up Telephone Call Date of Discharge: 05/02/24 Discharge Facility: [facility]) Type of Discharge: Emergency Department Reason for ED Visit: Other: (pt seen at [facility]  05/02/24 pt had no BM 7 days and pt was having rectal pain. no abd pain or vomiting but pt was nauseated on and off. pt had alot of gas. pt able to pass gas.pt received soap suds enema with good results. pt had BM since ED visit.) How have you been since you were released from the hospital?: Better Any questions or concerns?: No  Items Reviewed: Did you receive and understand the discharge instructions provided?: Yes Medications obtained,verified, and reconciled?: Yes (Medications Reviewed) (pt not taking mirtazapine  but stoppefd because pyt thought was causing constipatio.) Any new allergies since your discharge?: No Dietary orders reviewed?: NA Do you have support at home?: No People in Home [RPT]: alone  Medications Reviewed Today: Medications Reviewed Today     Reviewed by Tennie Laray HERO, LPN (Licensed Practical Nurse) on 05/07/24 at 1109  Med List Status: <None>   Medication Order Taking? Sig  Documenting Provider Last Dose Status Informant  albuterol  (VENTOLIN  HFA) 108 (90 Base) MCG/ACT inhaler [id number]  INHALE 2 PUFFS BY MOUTH EVERY 6 HOURS AS NEEDED FOR WHEEZE OR SHORTNESS OF SHERIDA Isadora Hose, MD  Active Self, Pharmacy Records  ANORO ELLIPTA  62.5-25 MCG/ACT AEPB [id number]  Inhale 1 puff into the lungs daily. Isadora Hose, MD  Active   docusate sodium  (COLACE) 100 MG capsule [id number]  Take 1 capsule (100 mg total) by mouth 2 (two) times daily. Dorothyann Drivers, MD  Active   mirtazapine  (REMERON ) 7.5 MG tablet [id number]  Take 1 tablet (7.5 mg total) by mouth at bedtime. Patrick Antu, FNP  Active             Home Care and Equipment/Supplies: Were Home Health Services Ordered?: NA Any new equipment or medical supplies ordered?: NA  Functional Questionnaire: Do you need assistance with bathing/showering or dressing?: No Do you need assistance with meal preparation?: No Do you need assistance with eating?: No Do you have difficulty maintaining continence: No Do you need assistance with getting out of bed/getting out of a chair/moving?: No Do you have difficulty managing or taking your medications?: No  Follow up appointments reviewed: PCP Follow-up appointment confirmed?: Yes Date of PCP follow-up appointment?: 05/08/24 Follow-up Provider: ONEIDA Patrick FNP [facility] Follow-up appointment confirmed?: NA Do you need transportation to your follow-up appointment?: No Do you understand care options if your condition(s) worsen?: Yes-patient verbalized understanding    SIGNATURE Laray Tennie, LPN

## 2024-05-08 ENCOUNTER — Telehealth: Payer: Self-pay | Admitting: Family

## 2024-05-08 ENCOUNTER — Ambulatory Visit: Payer: Self-pay | Admitting: Family

## 2024-05-08 ENCOUNTER — Encounter: Payer: Self-pay | Admitting: Family

## 2024-05-08 ENCOUNTER — Ambulatory Visit (INDEPENDENT_AMBULATORY_CARE_PROVIDER_SITE_OTHER): Admitting: Family

## 2024-05-08 ENCOUNTER — Ambulatory Visit (INDEPENDENT_AMBULATORY_CARE_PROVIDER_SITE_OTHER)
Admission: RE | Admit: 2024-05-08 | Discharge: 2024-05-08 | Disposition: A | Discharge status: Home / Self Care | Source: Ambulatory Visit | Attending: Family

## 2024-05-08 VITALS — BP 120/78 | HR 82 | Temp 97.8°F | Ht 61.0 in | Wt 85.0 lb

## 2024-05-08 DIAGNOSIS — R634 Abnormal weight loss: Secondary | ICD-10-CM

## 2024-05-08 DIAGNOSIS — Z720 Tobacco use: Secondary | ICD-10-CM

## 2024-05-08 DIAGNOSIS — R6883 Chills (without fever): Secondary | ICD-10-CM | POA: Diagnosis not present

## 2024-05-08 DIAGNOSIS — M533 Sacrococcygeal disorders, not elsewhere classified: Secondary | ICD-10-CM | POA: Diagnosis not present

## 2024-05-08 DIAGNOSIS — N9489 Other specified conditions associated with female genital organs and menstrual cycle: Secondary | ICD-10-CM

## 2024-05-08 DIAGNOSIS — Z741 Need for assistance with personal care: Secondary | ICD-10-CM

## 2024-05-08 DIAGNOSIS — R0689 Other abnormalities of breathing: Secondary | ICD-10-CM | POA: Insufficient documentation

## 2024-05-08 DIAGNOSIS — R82998 Other abnormal findings in urine: Secondary | ICD-10-CM

## 2024-05-08 DIAGNOSIS — K5909 Other constipation: Secondary | ICD-10-CM | POA: Diagnosis not present

## 2024-05-08 DIAGNOSIS — N3000 Acute cystitis without hematuria: Secondary | ICD-10-CM

## 2024-05-08 DIAGNOSIS — R0609 Other forms of dyspnea: Secondary | ICD-10-CM

## 2024-05-08 NOTE — Telephone Encounter (Signed)
 Can we check on status for echocardiogram?  Pt states that she has not yet received any call for her to schedule.  Order was placed 7/10

## 2024-05-08 NOTE — Progress Notes (Signed)
 Established Patient Office Visit  Subjective:      CC:  Chief Complaint  Patient presents with   Hospitalization Follow-up    [facility] ED follow up - 05/02/2024    HPI: Natalie Haynes is a 73 y.o. female presenting on 05/08/2024 for Hospitalization Follow-up [facility] ED follow up - 05/02/2024)  Natalie Haynes to ER for c/o constipation and abdominal pain 7/30. Had not had a bowel movement x one week, had recently seen a GI who is pending colonoscopy but had stated not 'healthy enough for this procedure.  She had had to manually disimpact stool herself to get any relief.  Xray in office reassuring without acute findings.  Normal cbc, cmp and lipase  U/s with trace leuks Had a full enema in patient with relief of the stool.  Discharged with colace twice daily.   She states today she is constipated again. She is uncomfortable but it is not painful. She did have a bowel movement yesterday but not complete, was in pieces. She is compliant to taking stool softener but she did not start taking this until yesterday.she is still losing weight, 7/30 she states ER did not weight her so 82 pounds is not correct. She was 89 pounds 7/10 so weight loss four pounds BMI 16. She thinks maybe remeron  was contributing to the constipation bc it didn't start until she started this, she has stopped this about 10 days ago.   Reviewed CT abd pelvis 7/1 diverticulosis, 10.6 cm cyst in pelvic (referred to gyn and they are monitoring this) 7 mm hypodense lesion pancreatic body. 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   Wt Readings from Last 3 Encounters:  05/08/24 85 lb (38.6 kg)  05/02/24 82 lb (37.2 kg)  04/12/24 89 lb 6.4 oz (40.6 kg)   C/o knot on her tailbone that is extremely painful.  Not worse with movement, actually relieved with movement. Worse with sitting and lying down. No known injury or trauma. The pain is intermittent, dull and achy.   States has felt as though she has had a fever x three days. No change  in DOE and or sob. No sore throat congestion or ear pain. At current no abdominal pain. She doesn't have any urinary symptoms.   Living situation, still no AC in her home, her central air isn't working she is having to go into the car to cool down, Still with trash in her home that she can't get out of her house   Slight change in Doe more winded than usual.  She does have a lot more mucous she is coughing up as well.     Social history:  Relevant past medical, surgical, family and social history reviewed and updated as indicated. Interim medical history since our last visit reviewed.  Allergies and medications reviewed and updated.  DATA REVIEWED: CHART IN EPIC     ROS: Negative unless specifically indicated above in HPI.    Current Outpatient Medications:    albuterol  (VENTOLIN  HFA) 108 (90 Base) MCG/ACT inhaler, INHALE 2 PUFFS BY MOUTH EVERY 6 HOURS AS NEEDED FOR WHEEZE OR SHORTNESS OF BREATH, Disp: 54 each, Rfl: 3   ANORO ELLIPTA  62.5-25 MCG/ACT AEPB, Inhale 1 puff into the lungs daily., Disp: 30 each, Rfl: 0   docusate sodium  (COLACE) 100 MG capsule, Take 1 capsule (100 mg total) by mouth 2 (two) times daily., Disp: 60 capsule, Rfl: 2      Objective:    BP 120/78 (BP Location: Left Arm, Patient  Position: Sitting, Cuff Size: Normal)   Pulse 82   Temp 97.8 F (36.6 C) (Temporal)   Ht 5' 1 (1.549 m)   Wt 85 lb (38.6 kg)   SpO2 96%   BMI 16.06 kg/m   Wt Readings from Last 3 Encounters:  05/08/24 85 lb (38.6 kg)  05/02/24 82 lb (37.2 kg)  04/12/24 89 lb 6.4 oz (40.6 kg)    Physical Exam Vitals reviewed.  Constitutional:      General: She is not in acute distress.    Appearance: Normal appearance. She is normal weight. She is not ill-appearing, toxic-appearing or diaphoretic.  HENT:     Head: Normocephalic.  Cardiovascular:     Rate and Rhythm: Normal rate and regular rhythm.  Pulmonary:     Effort: Pulmonary effort is normal.     Breath sounds: Decreased air  movement (luq) present. Examination of the left-upper field reveals decreased breath sounds. Decreased breath sounds present. No wheezing.  Abdominal:     General: Bowel sounds are normal.     Palpations: Abdomen is soft.     Tenderness: There is no abdominal tenderness.     Hernia: No hernia is present.  Musculoskeletal:     Lumbar back: Tenderness and bony tenderness present. No spasms. Decreased range of motion.     Comments: Sacral pain with prominent sacral bone   Neurological:     General: No focal deficit present.     Mental Status: She is alert and oriented to person, place, and time. Mental status is at baseline.  Psychiatric:        Mood and Affect: Mood normal.        Behavior: Behavior normal.        Thought Content: Thought content normal.        Judgment: Judgment normal.           Assessment & Plan:  Sacral pain Assessment & Plan: Suspect due to malnutrition with bony prominence Advised pt to start using bony pillow    Other constipation Assessment & Plan: Continue daily colace  Miralax recommended if needed Increase water intake F/u with GI    Chills -     Urinalysis w microscopic + reflex cultur; Future  Decreased breath sounds -     DG Chest 2 View; Future -     CT CHEST WO CONTRAST; Future  DOE (dyspnea on exertion) Assessment & Plan: Worsening Pending echo  Cxr today again, as today without movement in left upper lobe  Orders: -     CT CHEST WO CONTRAST; Future  Leukocytes in urine -     Urinalysis w microscopic + reflex cultur; Future  Tobacco abuse Assessment & Plan: Ordering CT scan without contrast  Smoking cessation instruction/counseling given:  counseled patient on the dangers of tobacco use, advised patient to stop smoking, and reviewed strategies to maximize success   Orders: -     CT CHEST WO CONTRAST; Future  Self-care deficit for bathing and hygiene Assessment & Plan: Ongoing pt following with social worker.      Pelvic cyst in female Assessment & Plan: Cont f/u with gyn    Abnormal weight loss Assessment & Plan: Following with GI pending colonoscopy however has to be at less risk for this procedure of which we are working on with close f/u   Pt pending appt with pulmonology for consult.  CT abd pelvis reviewed.  CT chest lung to r/o abn or neoplasm  Cont f/u with  GYN, also pending better health for surgery to remove pelvic mass       Return in about 1 month (around 06/08/2024) for f/u abn weight loss.  Ginger Patrick, MSN, APRN, FNP-C [facility]

## 2024-05-08 NOTE — Assessment & Plan Note (Signed)
 Ongoing pt following with Child psychotherapist.

## 2024-05-08 NOTE — Assessment & Plan Note (Signed)
 Following with GI pending colonoscopy however has to be at less risk for this procedure of which we are working on with close f/u   Pt pending appt with pulmonology for consult.  CT abd pelvis reviewed.  CT chest lung to r/o abn or neoplasm  Cont f/u with GYN, also pending better health for surgery to remove pelvic mass

## 2024-05-08 NOTE — Patient Instructions (Addendum)
   Call Gi to get back in with them for your constipation.  Natalie Haynes, [place]  [address]  [place], [place] [address]  Phone: tel:[phone]  fax:[phone]   ------------------------------------ Use a donut pillow for your buttocks for some relief.

## 2024-05-08 NOTE — Assessment & Plan Note (Signed)
 Worsening Pending echo  Cxr today again, as today without movement in left upper lobe

## 2024-05-08 NOTE — Telephone Encounter (Signed)
 Patient was made aware of where to call to schedule Echo.  See MyChart message - patient read message on 04/27/24 at 8:07pm

## 2024-05-08 NOTE — Assessment & Plan Note (Signed)
 Continue daily colace  Miralax recommended if needed Increase water intake F/u with GI

## 2024-05-08 NOTE — Telephone Encounter (Signed)
 Copied from CRM [id number]. Topic: General - Other >> May 08, 2024  2:01 PM Robinson H wrote: Reason for CRM: Patient had a hospital follow up appointment today with Tabitha and wants to know if she still needs to come in for appointment on 8/11, please reach out to patient to clarify.  Natalie Haynes [phone]

## 2024-05-08 NOTE — Telephone Encounter (Signed)
 Spoke with pt. She has been given the information to get this scheduled.

## 2024-05-08 NOTE — Assessment & Plan Note (Signed)
 Suspect due to malnutrition with bony prominence Advised pt to start using bony pillow

## 2024-05-08 NOTE — Telephone Encounter (Signed)
 Copied from CRM [id number]. Topic: General - Other >> May 08, 2024 11:23 AM Rosina BIRCH wrote: Reason for CRM: patient called wanting to let her doctor know she got home ok

## 2024-05-08 NOTE — Telephone Encounter (Signed)
 Can you please call Natalie Haynes with this information ?  She doesn't check mychart, has issues finding it.

## 2024-05-08 NOTE — Assessment & Plan Note (Signed)
Cont f/u with gyn

## 2024-05-08 NOTE — Assessment & Plan Note (Signed)
 Ordering CT scan without contrast  Smoking cessation instruction/counseling given:  counseled patient on the dangers of tobacco use, advised patient to stop smoking, and reviewed strategies to maximize success

## 2024-05-08 NOTE — Telephone Encounter (Signed)
 NOTED

## 2024-05-08 NOTE — Telephone Encounter (Signed)
 Hey Natalie Haynes,   Pt missed your last call as she was in the Er.  She still has not had any change unfortunately in her living situation, no help at current.  Is there anything she can utilize? She does not have AC, has trash in her home due to not being able to carry it out, and she struggling with the heat. Financial difficulties.

## 2024-05-09 NOTE — Telephone Encounter (Signed)
 No she just needs to make a 1 month follow up.  Make sure she remembers to call GI about recent constipation as well so they are aware.

## 2024-05-09 NOTE — Telephone Encounter (Signed)
 Left Detailed message for patient to schedule in 1 month to follow up cancelled 8/11 apt and for her to call GI provider.

## 2024-05-11 LAB — CULTURE INDICATED

## 2024-05-11 LAB — URINE CULTURE
MICRO NUMBER:: 16791621
SPECIMEN QUALITY:: ADEQUATE

## 2024-05-11 LAB — URINALYSIS W MICROSCOPIC + REFLEX CULTURE
Bacteria, UA: NONE SEEN /HPF
Bilirubin Urine: NEGATIVE
Glucose, UA: NEGATIVE
Hgb urine dipstick: NEGATIVE
Hyaline Cast: NONE SEEN /LPF
Ketones, ur: NEGATIVE
Nitrites, Initial: NEGATIVE
Protein, ur: NEGATIVE
RBC / HPF: NONE SEEN /HPF (ref 0–2)
Specific Gravity, Urine: 1.016 (ref 1.001–1.035)
WBC, UA: NONE SEEN /HPF (ref 0–5)
pH: 6 (ref 5.0–8.0)

## 2024-05-11 MED ORDER — CIPROFLOXACIN HCL 500 MG PO TABS: 500.0000 mg | ORAL_TABLET | Freq: Two times a day (BID) | ORAL | 0 refills | Status: AC

## 2024-05-14 ENCOUNTER — Ambulatory Visit: Admitting: Family

## 2024-05-17 ENCOUNTER — Other Ambulatory Visit: Payer: Self-pay | Admitting: *Deleted

## 2024-05-18 ENCOUNTER — Other Ambulatory Visit: Payer: Self-pay

## 2024-05-18 NOTE — Patient Outreach (Addendum)
 Complex Care Management   Visit Note  05/18/2024  Name:  Natalie Haynes MRN: [id number] DOB: Aug 30, 1951  Situation: Referral received for Complex Care Management related to SDOH Barriers:  Housing repairs Food insecurity Financial Resource Strain I obtained verbal consent from Patient.  Visit completed with patient  on the phone  Background:   Past Medical History:  Diagnosis Date   Bronchitis    Hearing impaired     Assessment: SW completed a telephone outreach with patient, she states she is in need of home repairs for her trailer. She is in need of AC/heat, hot water, and cleaning up the home. Patient states she also needs assistance with food. She was receiving foodsstamps and does not know why they stopped. Resources for home repairs and food have been emailed to address on file. Referral for [place] has been submitted. Patient states she is working with [facility] to get assistance with home repairs but has not heard anything from them. SDOH Interventions    Flowsheet Row Patient Outreach Telephone from 04/20/2024 in [place] POPULATION HEALTH DEPARTMENT Clinical Support from 05/30/2023 in [facility] [place] Telephone from 05/12/2023 in [facility] Care Coordination  SDOH Interventions     Food Insecurity Interventions Intervention Not Indicated Intervention Not Indicated Intervention Not Indicated  Housing Interventions Intervention Not Indicated  [does not have the rent this month, but has had it prior] Intervention Not Indicated Intervention Not Indicated  Transportation Interventions Intervention Not Indicated Intervention Not Indicated Intervention Not Indicated  Utilities Interventions Intervention Not Indicated Intervention Not Indicated --  Alcohol Usage Interventions -- Intervention Not Indicated (Score <7) --  Depression Interventions/Treatment  -- PHQ2-9 Score <4 Follow-up Not Indicated --  Financial Strain Interventions --  Intervention Not Indicated --  Physical Activity Interventions -- Intervention Not Indicated --  Stress Interventions -- Intervention Not Indicated --  Social Connections Interventions -- Intervention Not Indicated --  Health Literacy Interventions -- Intervention Not Indicated --      Recommendation:   No recommendations at this time  Follow Up Plan:   Telephone follow-up 05/29/24 at 12:30  Thersia Hoar, BSW, MHA [place]  [facility] Social Worker, Population Health [phone]

## 2024-05-18 NOTE — Patient Instructions (Signed)
 Tailored Plan Medicaid On April 04, 2023 some people on [place] Medicaid will move to a new kind of Medicaid health plan called a Tailored Plan. Tailored Plans cover your doctor visits, prescription drugs, and health care services.    If your [place] Medicaid will move to a Tailored Plan, you should have gotten a letter and welcome packet. If you're not sure, call your [place] Medicaid Enrollment Broker at [phone] and ask.  Check out these free materials, in [place] and [place], to learn more about your Tailored Plan: [email or web address]-Plans/Toolkit  Tailored Care Management Services  TCM services are available to you now. If you are a Tailored Plan member or will be and want information about Tailored Care Management Services including rides to appointments and community and home services, call the Care Management provider for your county of residence:    [facility] ([place], [place])  Member Services: [phone] Behavioral Health Crisis Line: 608-792-8884, [place], [place], [place], [place])  Member Services: [phone] Behavioral Health Crisis Line: 613-871-9895  [facility] [place]) Member Services: [phone] Behavioral Health Crisis Line: [phone]

## 2024-05-18 NOTE — Patient Outreach (Signed)
 Complex Care Management   Visit Note  05/18/2024  Name:  Natalie Haynes MRN: [id number] DOB: 1951-01-22  Situation: Expand All Collapse All Complex Care Management    Visit Note   04/23/2024   Name:  Natalie Haynes          MRN: [id number]       DOB: 1951-03-23   Situation: Referral received for Complex Care Management related to SDOH Barriers:  Housing instability Lack of essential utilities no AC or hot water , in need of home repair,Depression Financial Resource Strain I obtained verbal consent from Patient.  Visit completed with patient  on the phone on 05/18/24     Background:   Past Medical History:  Diagnosis Date   Bronchitis    Hearing impaired     Assessment: Patient Reported Symptoms:  Cognitive Cognitive Status: Alert and oriented to person, place, and time, Normal speech and language skills, Insightful and able to interpret abstract concepts Cognitive/Intellectual Conditions Management [RPT]: None reported or documented in medical history or problem list   Health Maintenance Behaviors: Annual physical exam Healing Pattern: Average Health Facilitated by: Stress management  Neurological Neurological Review of Symptoms: No symptoms reported    HEENT HEENT Symptoms Reported: No symptoms reported      Cardiovascular Cardiovascular Symptoms Reported: No symptoms reported Does patient have uncontrolled Hypertension?: No    Respiratory Respiratory Symptoms Reported: Shortness of breath Other Respiratory Symptoms: echo cardiogram 06/07/24, Additional Respiratory Details: COPD, chronic bronchitis, emphasyma Respiratory Management Strategies: Adequate rest, Medication therapy  Endocrine Endocrine Symptoms Reported: No symptoms reported Is patient diabetic?: No    Gastrointestinal Gastrointestinal Symptoms Reported: Unintentional weight loss Additional Gastrointestinal Details: needs colonoscopy but reports not healthy enough for the procedure-curently on stood  softeners-drinks 1 protein drinks per day Gastrointestinal Comment: colonoscomy-not scheduled    Genitourinary Genitourinary Symptoms Reported: No symptoms reported    Integumentary Integumentary Symptoms Reported: No symptoms reported    Musculoskeletal Musculoskelatal Symptoms Reviewed: Not assessed        Psychosocial Psychosocial Symptoms Reported: Depression - if selected complete PHQ 2-9 Additional Psychological Details: grief symptons related to  loss of brother, and increased financial strain-no AC -  increased fatigue/sadness. difficulty completing ADL's and IADl's Behavioral Management Strategies: [employer] Self-Management Outcome: 3 (uncertain) Major Change/Loss/Stressor/Fears (CP): Death of a loved one, Resources Techniques to [place] with Loss/Stress/Change: Withdraw, Spiritual practice(s) Quality of Family Relationships: supportive Do you feel physically threatened by others?: No      05/08/2024    9:53 AM  Depression screen PHQ 2/9  Decreased Interest 2  Down, Depressed, Hopeless 2  PHQ - 2 Score 4  Altered sleeping 2  Tired, decreased energy 3  Change in appetite 3  Feeling bad or failure about yourself  3  Trouble concentrating 0  Moving slowly or fidgety/restless 0  Suicidal thoughts 0  PHQ-9 Score 15  Difficult doing work/chores Very difficult    There were no vitals filed for this visit.  Medications Reviewed Today     Reviewed by Ermalinda Lenn HERO, LCSW (Social Worker) on 05/17/24 at 1506  Med List Status: <None>   Medication Order Taking? Sig Documenting Provider Last Dose Status Informant  albuterol  (VENTOLIN  HFA) 108 (90 Base) MCG/ACT inhaler [id number]  INHALE 2 PUFFS BY MOUTH EVERY 6 HOURS AS NEEDED FOR WHEEZE OR SHORTNESS OF SHERIDA Isadora Hose, MD  Active Self, Pharmacy Records  ANORO ELLIPTA  62.5-25 MCG/ACT AEPB [id number]  Inhale 1 puff into the lungs daily. Dgayli, [id number],  MD  Active   docusate sodium  (COLACE) 100 MG  capsule [id number]  Take 1 capsule (100 mg total) by mouth 2 (two) times daily. Dorothyann Drivers, MD  Active             Recommendation:   PCP Follow-up Continue to work with care management team to assist with any availalble assistance related to home repairs and financial strain  Follow Up Plan:   Telephone follow up appointment date/time:  06/05/24  Lenn Mean, LCSW [place]  [facility], [facility] Licensed Clinical Social Worker  Direct Dial: [phone]

## 2024-05-18 NOTE — Patient Instructions (Signed)
 Visit Information  Thank you for taking time to visit with me today. Please don't hesitate to contact me if I can be of assistance to you before our next scheduled appointment.  Your next care management appointment is by telephone on 06/05/24 at 2pm    Please call the care guide team at [phone] if you need to cancel, schedule, or reschedule an appointment.   Please call the Suicide and Crisis Lifeline: 988 call the [place]  National Suicide Prevention Lifeline: 608-160-8435 or TTY: 651-191-8376 TTY 450-408-7655) to talk to a trained counselor call 1-800-273-TALK (toll free, 24 hour hotline) if you are experiencing a Mental Health or Behavioral Health Crisis or need someone to talk to.  Bronda Alfred, LCSW [place]  [facility], [facility] Licensed Clinical Social Worker  Direct Dial: [phone]

## 2024-05-21 ENCOUNTER — Ambulatory Visit: Admitting: Nurse Practitioner

## 2024-05-23 ENCOUNTER — Encounter: Payer: Self-pay | Admitting: *Deleted

## 2024-05-23 DIAGNOSIS — N393 Stress incontinence (female) (male): Secondary | ICD-10-CM

## 2024-05-24 ENCOUNTER — Other Ambulatory Visit: Payer: Self-pay | Admitting: Student in an Organized Health Care Education/Training Program

## 2024-05-24 DIAGNOSIS — R0602 Shortness of breath: Secondary | ICD-10-CM

## 2024-05-29 ENCOUNTER — Other Ambulatory Visit: Payer: Self-pay

## 2024-05-29 NOTE — Patient Outreach (Signed)
 Complex Care Management   Visit Note  05/29/2024  Name:  Natalie Haynes MRN: [id number] DOB: 1950/10/30  Situation: Referral received for Complex Care Management related to SDOH Barriers:  Food insecurity and housing repairs I obtained verbal consent from Patient.  Visit completed with Patient  on the phone  Background:   Past Medical History:  Diagnosis Date   Bronchitis    Hearing impaired     Assessment: SW completed a telephone outreach with patient, she states she did receive the resources SW sent via email and she has been making calls. Patient states the housing repairs with [facility] and working with [facility] and she did speak with them yesterday. She states they may put her in a hotel for a few days. Patient states her son is also looking into an independent living facility near him for her to go too.   SDOH Interventions    Flowsheet Row Patient Outreach Telephone from 04/20/2024 in [place] POPULATION HEALTH DEPARTMENT Clinical Support from 05/30/2023 in [facility] [place] [facility] [place] Telephone from 05/12/2023 in [facility] Care Coordination  SDOH Interventions     Food Insecurity Interventions Intervention Not Indicated Intervention Not Indicated Intervention Not Indicated  Housing Interventions Intervention Not Indicated  [does not have the rent this month, but has had it prior] Intervention Not Indicated Intervention Not Indicated  Transportation Interventions Intervention Not Indicated Intervention Not Indicated Intervention Not Indicated  Utilities Interventions Intervention Not Indicated Intervention Not Indicated --  Alcohol Usage Interventions -- Intervention Not Indicated (Score <7) --  Depression Interventions/Treatment  -- PHQ2-9 Score <4 Follow-up Not Indicated --  Financial Strain Interventions -- Intervention Not Indicated --  Physical Activity Interventions -- Intervention Not Indicated --  Stress Interventions -- Intervention  Not Indicated --  Social Connections Interventions -- Intervention Not Indicated --  Health Literacy Interventions -- Intervention Not Indicated --    Recommendation:   No recommendations at this time  Follow Up Plan:   Telephone follow-up 06/14/24 at 11am  Thersia Hoar, BSW, MHA [place]  [facility] Social Worker, Population Health [phone]

## 2024-05-29 NOTE — Patient Instructions (Signed)
 Tailored Plan Medicaid On April 04, 2023 some people on [place] Medicaid will move to a new kind of Medicaid health plan called a Tailored Plan. Tailored Plans cover your doctor visits, prescription drugs, and health care services.    If your [place] Medicaid will move to a Tailored Plan, you should have gotten a letter and welcome packet. If you're not sure, call your [place] Medicaid Enrollment Broker at [phone] and ask.  Check out these free materials, in [place] and [place], to learn more about your Tailored Plan: [email or web address]-Plans/Toolkit  Tailored Care Management Services  TCM services are available to you now. If you are a Tailored Plan member or will be and want information about Tailored Care Management Services including rides to appointments and community and home services, call the Care Management provider for your county of residence:    [facility] ([place], [place])  Member Services: [phone] Behavioral Health Crisis Line: 608-792-8884, [place], [place], [place], [place])  Member Services: [phone] Behavioral Health Crisis Line: 613-871-9895  [facility] [place]) Member Services: [phone] Behavioral Health Crisis Line: [phone]

## 2024-06-05 ENCOUNTER — Other Ambulatory Visit: Payer: Self-pay | Admitting: *Deleted

## 2024-06-05 ENCOUNTER — Ambulatory Visit: Admitting: Family

## 2024-06-05 NOTE — Progress Notes (Deleted)
 Patient ID: Natalie Haynes, female     DOB: 01-May-1951, 73 y.o.      MRN: [id number]  No chief complaint on file.   Virtual Visit via Video Note  I connected with Heron Fire on 06/05/24 at  1:30 PM EDT by a video enabled telemedicine application and verified that I am speaking with the correct person using two identifiers.  Location: Patient: Home Provider: Office   I discussed the limitations of evaluation and management by telemedicine and the availability of in person appointments. The patient expressed understanding and agreed to proceed.  History of Present Illness: 73 year old female, active smoker followed for shortness of breath and chronic bronchitis. She is a patient of Dr. Clydene and last seen in office 11/11/2022. Past medical history significant for AAA, depression.   TESTS/EVENTS: 11/23/2022 CT chest: atherosclerosis. Scarring in apices. Calcified granuloma in RUL. Diffuse emphysematous changes throughout the lungs.  11/11/2022: OV with Dr. Isadora. Eval of SOB, cough, sputum production consistent with COPD/bronchitis. PFT ordered. Re-filled Anoro. Needs repeat CT for follow up of nodule identified by PCP. Counseled on smoking cessation.   Allergies  Allergen Reactions   Codeine     REACTION: nausea, abd cramps   Ibuprofen     REACTION: antiphylactic shock   Penicillins     REACTION: rash   Remeron  Heribert.Hamburger ] Other (See Comments)    Constipation     There is no immunization history on file for this patient. Past Medical History:  Diagnosis Date   Bronchitis    Hearing impaired     Tobacco History: Social History   Tobacco Use  Smoking Status Every Day   Current packs/day: 2.00   Average packs/day: 2.0 packs/day for 50.0 years (100.0 ttl pk-yrs)   Types: Cigarettes  Smokeless Tobacco Never   Ready to quit: Not Answered Counseling given: Not Answered   Outpatient Medications Prior to Visit  Medication Sig Dispense Refill   albuterol  (VENTOLIN   HFA) 108 (90 Base) MCG/ACT inhaler INHALE 2 PUFFS BY MOUTH EVERY 6 HOURS AS NEEDED FOR WHEEZE OR SHORTNESS OF BREATH 54 each 3   ANORO ELLIPTA  62.5-25 MCG/ACT AEPB TAKE 1 PUFF BY MOUTH EVERY DAY 60 each 0   docusate sodium  (COLACE) 100 MG capsule Take 1 capsule (100 mg total) by mouth 2 (two) times daily. 60 capsule 2   No facility-administered medications prior to visit.     Review of Systems:   Constitutional: No weight loss or gain, night sweats, fevers, chills, fatigue, or lassitude. HEENT: No headaches, difficulty swallowing, tooth/dental problems, or sore throat. No sneezing, itching, ear ache, nasal congestion, or post nasal drip CV:  No chest pain, orthopnea, PND, swelling in lower extremities, anasarca, dizziness, palpitations, syncope Resp: No shortness of breath with exertion or at rest. No excess mucus or change in color of mucus. No productive or non-productive. No hemoptysis. No wheezing.  No chest wall deformity GI:  No heartburn, indigestion, abdominal pain, nausea, vomiting, diarrhea, change in bowel habits, loss of appetite, bloody stools.  GU: No dysuria, change in color of urine, urgency or frequency.  No flank pain, no hematuria  Skin: No rash, lesions, ulcerations MSK:  No joint pain or swelling.  No decreased range of motion.  No back pain. Neuro: No dizziness or lightheadedness.  Psych: No depression or anxiety. Mood stable.   Observations/Objective:   Assessment and Plan: No problem-specific Assessment & Plan notes found for this encounter.    Follow Up Instructions:  I discussed the assessment and treatment plan with the patient. The patient was provided an opportunity to ask questions and all were answered. The patient agreed with the plan and demonstrated an understanding of the instructions.   The patient was advised to call back or seek an in-person evaluation if the symptoms worsen or if the condition fails to improve as anticipated.  I provided ***  minutes of non-face-to-face time during this encounter.   [place] LULLA Rouleau, NP

## 2024-06-05 NOTE — Patient Instructions (Signed)
 Visit Information  Thank you for taking time to visit with me today. Please don't hesitate to contact me if I can be of assistance to you before our next scheduled appointment.  Your next care management appointment is by telephone on 06/20/24  at 2pm   Please call the care guide team at [phone] if you need to cancel, schedule, or reschedule an appointment.   Please call the Suicide and Crisis Lifeline: 988 call the [place]  National Suicide Prevention Lifeline: 669 867 5357 or TTY: (912) 122-0539 TTY 905-609-7952) to talk to a trained counselor call 1-800-273-TALK (toll free, 24 hour hotline) if you are experiencing a Mental Health or Behavioral Health Crisis or need someone to talk to.  Mariateresa Batra, LCSW [place]  [facility], [facility] Licensed Clinical Social Worker  Direct Dial: [phone]

## 2024-06-05 NOTE — Patient Outreach (Addendum)
 Complex Care Management   Visit Note  06/05/2024  Name:  Natalie Haynes MRN: [id number] DOB: 15-May-1951  Situation: Referral received for Complex Care Management related to SDOH Barriers:  Housing instability Lack of essential utilities no AC or hot water , in need of home repair, Depression  Financial Resource Strain I obtained verbal consent from Patient.  Visit completed with patient  on the phone.  Background:   Past Medical History:  Diagnosis Date   Bronchitis    Hearing impaired     Assessment: Patient Reported Symptoms:  Cognitive Cognitive Status: Alert and oriented to person, place, and time      Neurological Neurological Review of Symptoms: No symptoms reported    HEENT HEENT Symptoms Reported: No symptoms reported      Cardiovascular Cardiovascular Symptoms Reported: No symptoms reported Does patient have uncontrolled Hypertension?: No Cardiovascular Comment: echo cardiogram scheduled for 06/06/24  Respiratory Respiratory Symptoms Reported: Shortness of breath Other Respiratory Symptoms: , pulomonary on 05/08/35 Additional Respiratory Details: COPD, chronic Bronchitis, emphasuma    Endocrine Endocrine Symptoms Reported: No symptoms reported    Gastrointestinal Gastrointestinal Symptoms Reported: Unintentional weight loss Additional Gastrointestinal Details: needs colonoscopy but reports not healthy enough, losing muscle mass. down to 83 pounds, typically Gastrointestinal Self-Management Outcome: 3 (uncertain)    Genitourinary Genitourinary Symptoms Reported: No symptoms reported    Integumentary Integumentary Symptoms Reported: No symptoms reported    Musculoskeletal Musculoskelatal Symptoms Reviewed: No symptoms reported        Psychosocial Psychosocial Symptoms Reported: Depression - if selected complete PHQ 2-9 Additional Psychological Details: continued grief response related to loss of brother and increased financial strain-increased fatigue/sadness,  difficulty completing ADL's and IADL's-resides in a trailor with no AC and in need of major repairs. WIlling to consider independent living or assisted living Behavioral Management Strategies: Community resources Behaviors When Feeling Stressed/Fearful: trying to stay busy, keeping ming occupie doing other things Techniques to Cope with Loss/Stress/Change: Withdraw, Spiritual practice(s) Quality of Family Relationships: supportive Do you feel physically threatened by others?: No    06/05/2024    PHQ2-9 Depression Screening   Little interest or pleasure in doing things    Feeling down, depressed, or hopeless    PHQ-2 - Total Score    Trouble falling or staying asleep, or sleeping too much    Feeling tired or having little energy    Poor appetite or overeating     Feeling bad about yourself - or that you are a failure or have let yourself or your family down    Trouble concentrating on things, such as reading the newspaper or watching television    Moving or speaking so slowly that other people could have noticed.  Or the opposite - being so fidgety or restless that you have been moving around a lot more than usual    Thoughts that you would be better off dead, or hurting yourself in some way    PHQ2-9 Total Score    If you checked off any problems, how difficult have these problems made it for you to do your work, take care of things at home, or get along with other people    Depression Interventions/Treatment      There were no vitals filed for this visit.  Medications Reviewed Today     Reviewed by Ermalinda Lenn HERO, LCSW (Social Worker) on 06/05/24 at 1807  Med List Status: <None>   Medication Order Taking? Sig Documenting Provider Last Dose Status Informant  albuterol  (VENTOLIN  HFA)  108 (90 Base) MCG/ACT inhaler [id number] Yes INHALE 2 PUFFS BY MOUTH EVERY 6 HOURS AS NEEDED FOR WHEEZE OR SHORTNESS OF SHERIDA Isadora Hose, MD  Active Self, Pharmacy Records  ANORO ELLIPTA  62.5-25  MCG/ACT AEPB [id number] Yes TAKE 1 PUFF BY MOUTH EVERY DAY Isadora Hose, MD  Active   docusate sodium  (COLACE) 100 MG capsule [id number] Yes Take 1 capsule (100 mg total) by mouth 2 (two) times daily. Dorothyann Drivers, MD  Active             Recommendation:   PCP Follow-up  06/12/24 Specialty provider follow-up 06/07/24 Review housing resources sent by email  Follow Up Plan:   Telephone follow-up 06/20/24    Lenn Mean, LCSW [place]  [facility], [facility] Licensed Clinical Social Worker  Direct Dial: [phone]

## 2024-06-05 NOTE — Progress Notes (Signed)
 Natalie Haynes patient has already been rescheduled, Thersia spoke with her since then and has upcoming appt on 06/14/24.

## 2024-06-06 ENCOUNTER — Ambulatory Visit

## 2024-06-07 ENCOUNTER — Ambulatory Visit: Admitting: Nurse Practitioner

## 2024-06-12 ENCOUNTER — Ambulatory Visit: Admitting: Family

## 2024-06-12 ENCOUNTER — Encounter: Payer: Self-pay | Admitting: Family

## 2024-06-12 ENCOUNTER — Ambulatory Visit: Payer: Self-pay | Admitting: Family

## 2024-06-12 VITALS — BP 110/80 | HR 74 | Temp 98.0°F | Ht 61.0 in | Wt 83.0 lb

## 2024-06-12 DIAGNOSIS — Z20822 Contact with and (suspected) exposure to covid-19: Secondary | ICD-10-CM

## 2024-06-12 DIAGNOSIS — J22 Unspecified acute lower respiratory infection: Secondary | ICD-10-CM

## 2024-06-12 DIAGNOSIS — R82998 Other abnormal findings in urine: Secondary | ICD-10-CM

## 2024-06-12 DIAGNOSIS — N390 Urinary tract infection, site not specified: Secondary | ICD-10-CM | POA: Diagnosis not present

## 2024-06-12 DIAGNOSIS — N3945 Continuous leakage: Secondary | ICD-10-CM | POA: Diagnosis not present

## 2024-06-12 DIAGNOSIS — J029 Acute pharyngitis, unspecified: Secondary | ICD-10-CM | POA: Diagnosis not present

## 2024-06-12 LAB — URINALYSIS, ROUTINE W REFLEX MICROSCOPIC
Bilirubin Urine: NEGATIVE
Hgb urine dipstick: NEGATIVE
Ketones, ur: NEGATIVE
Leukocytes,Ua: NEGATIVE
Nitrite: NEGATIVE
RBC / HPF: NONE SEEN (ref 0–?)
Specific Gravity, Urine: 1.025 (ref 1.000–1.030)
Total Protein, Urine: 30 — AB
Urine Glucose: NEGATIVE
Urobilinogen, UA: 0.2 (ref 0.0–1.0)
pH: 5.5 (ref 5.0–8.0)

## 2024-06-12 LAB — POCT URINE DIPSTICK
Bilirubin, UA: NEGATIVE
Blood, UA: NEGATIVE
Glucose, UA: NEGATIVE mg/dL
Ketones, POC UA: NEGATIVE mg/dL
Leukocytes, UA: NEGATIVE
Nitrite, UA: NEGATIVE
Spec Grav, UA: 1.025 (ref 1.010–1.025)
Urobilinogen, UA: 0.2 U/dL
pH, UA: 5.5 (ref 5.0–8.0)

## 2024-06-12 LAB — POCT RAPID STREP A (OFFICE): Rapid Strep A Screen: NEGATIVE

## 2024-06-12 LAB — POC COVID19 BINAXNOW: SARS Coronavirus 2 Ag: NEGATIVE

## 2024-06-12 MED ORDER — DOXYCYCLINE HYCLATE 100 MG PO TABS: 100.0000 mg | ORAL_TABLET | Freq: Two times a day (BID) | ORAL | 0 refills | Status: AC

## 2024-06-12 NOTE — Patient Instructions (Addendum)
  Call gastroenterologist to get an appointment scheduled.   Natalie Haynes [address] [place] [address]   ------------------------------------

## 2024-06-12 NOTE — Progress Notes (Signed)
 Established Patient Office Visit  Subjective:      CC:  Chief Complaint  Patient presents with   Follow-up    1 month follow up    HPI: Natalie Haynes is a 73 y.o. female presenting on 06/12/2024 for Follow-up (1 month follow up) .  Discussed the use of AI scribe software for clinical note transcription with the patient, who gave verbal consent to proceed.  History of Present Illness Natalie Haynes is a 73 year old female who presents with urinary urgency and frequency.  She has been experiencing ongoing urinary urgency and frequency, which have been treated with Cipro  three times, providing only temporary relief. Symptoms return shortly after treatment, and she experiences leakage when the urgency is strong, although not every time. A urine culture was performed on August 5th.  She mentions weakness in her strength with muscles and has been consuming one Ensure daily to help with nutrition, but is about to run out. She is mostly homebound due to lack of transportation and financial constraints, which limits her ability to purchase groceries and other necessities. She reports her weight is currently 83 pounds.  She has a history of fecal incontinence, which has improved since using an enema about three weeks ago. She reports normal bowel movements now and no longer experiences leakage.  She is experiencing symptoms of a possible upper respiratory infection, including a scratchy throat, sneezing, runny nose, and intermittent fever, with the last fever occurring last night. There is a general feeling of malaise.  She is currently using Anoro for her pulmonary condition and has an appointment with her pulmonologist tomorrow. She struggles with basic needs such as heating and groceries, relying on space heaters for warmth and purchasing affordable food items like soup and crackers.   Wt Readings from Last 3 Encounters:  06/12/24 83 lb (37.6 kg)  05/08/24 85 lb (38.6 kg)  05/02/24 82  lb (37.2 kg)     Wt Readings from Last 3 Encounters:  06/12/24 83 lb (37.6 kg)  05/08/24 85 lb (38.6 kg)  05/02/24 82 lb (37.2 kg)   Temp Readings from Last 3 Encounters:  06/12/24 98 F (36.7 C) (Temporal)  05/08/24 97.8 F (36.6 C) (Temporal)  05/02/24 98.1 F (36.7 C) (Oral)   BP Readings from Last 3 Encounters:  06/12/24 110/80  05/08/24 120/78  05/02/24 122/69   Pulse Readings from Last 3 Encounters:  06/12/24 74  05/08/24 82  05/02/24 70        Social history:  Relevant past medical, surgical, family and social history reviewed and updated as indicated. Interim medical history since our last visit reviewed.  Allergies and medications reviewed and updated.  DATA REVIEWED: CHART IN EPIC     ROS: Negative unless specifically indicated above in HPI.    Current Outpatient Medications:    albuterol  (VENTOLIN  HFA) 108 (90 Base) MCG/ACT inhaler, INHALE 2 PUFFS BY MOUTH EVERY 6 HOURS AS NEEDED FOR WHEEZE OR SHORTNESS OF BREATH, Disp: 54 each, Rfl: 3   ANORO ELLIPTA  62.5-25 MCG/ACT AEPB, TAKE 1 PUFF BY MOUTH EVERY DAY, Disp: 60 each, Rfl: 0   docusate sodium  (COLACE) 100 MG capsule, Take 1 capsule (100 mg total) by mouth 2 (two) times daily., Disp: 60 capsule, Rfl: 2   doxycycline  (VIBRA -TABS) 100 MG tablet, Take 1 tablet (100 mg total) by mouth 2 (two) times daily for 10 days., Disp: 20 tablet, Rfl: 0        Objective:  BP 110/80 (BP Location: Left Arm, Patient Position: Sitting, Cuff Size: Normal)   Pulse 74   Temp 98 F (36.7 C) (Temporal)   Ht 5' 1 (1.549 m)   Wt 83 lb (37.6 kg)   SpO2 96%   BMI 15.68 kg/m   Physical Exam VITALS: SaO2- 96% MEASUREMENTS: Weight- 83 lbs. NECK: No cervical lymphadenopathy.  Wt Readings from Last 3 Encounters:  06/12/24 83 lb (37.6 kg)  05/08/24 85 lb (38.6 kg)  05/02/24 82 lb (37.2 kg)    Physical Exam Vitals reviewed.  Constitutional:      General: She is not in acute distress.    Appearance:  Normal appearance. She is normal weight. She is not ill-appearing, toxic-appearing or diaphoretic.  HENT:     Head: Normocephalic.  Cardiovascular:     Rate and Rhythm: Normal rate.  Pulmonary:     Effort: Pulmonary effort is normal.     Breath sounds: Decreased air movement present. Examination of the right-upper field reveals decreased breath sounds. Examination of the left-upper field reveals decreased breath sounds. Examination of the right-middle field reveals decreased breath sounds. Examination of the left-middle field reveals decreased breath sounds. Examination of the right-lower field reveals decreased breath sounds. Examination of the left-lower field reveals decreased breath sounds. Decreased breath sounds present.  Musculoskeletal:        General: Normal range of motion.  Neurological:     General: No focal deficit present.     Mental Status: She is alert and oriented to person, place, and time. Mental status is at baseline.  Psychiatric:        Mood and Affect: Mood normal.        Behavior: Behavior normal.        Thought Content: Thought content normal.        Judgment: Judgment normal.          Results LABS   Urine Culture: Sensitive to Ciprofloxacin  (05/08/2024)  Assessment & Plan:   Assessment and Plan Assessment & Plan Recurrent urinary tract infection with urgency, frequency, and incontinence Persistent urinary urgency, frequency, and occasional incontinence despite previous treatment with Cipro . Symptoms improve slightly with Cipro  but recur, suggesting a possible resistant or recurrent infection. - Send urine for culture and sensitivity - Refer to urology for further evaluation and management - Consider longer course or different antibiotic based on culture results, pending  Sacral pressure ulcer with pain Sacral pressure ulcer with associated pain. She is thin and at risk for pressure ulcers due to low body weight and limited mobility. - Recommend use of  Desitin for its zinc properties to act as a barrier - Encourage high-calorie, high-protein diet to promote healing - Resend pillow to provide support and prevent further pressure ulcers  Abnormal weight loss and low body weight Significant weight loss with current weight at 83 pounds. Limited mobility and financial constraints affecting nutritional intake. Discussed need to gain at least 20 pounds and build muscle mass. - Provide high-calorie, high-protein diet recommendations - Encourage increased intake of nutritional supplements like Ensure or Boost - Provide food pantry resources for additional nutritional support  Acute upper respiratory infection with pharyngitis Symptoms of acute upper respiratory infection including scratchy throat, runny nose, postnasal drip, and intermittent fever. No wheezing noted. -covid strep negative  -will treat with doxycycline  100 mg po bid x 10 days due to pt comorbidites and decreased breath sounds on auscultation. Advised pt ER precautions.   Recording duration: 18 minutes  Return in about 3 weeks (around 07/03/2024) for f/u weight loss.     Ginger Patrick, MSN, APRN, FNP-C [facility]

## 2024-06-13 ENCOUNTER — Ambulatory Visit: Admitting: Nurse Practitioner

## 2024-06-13 ENCOUNTER — Other Ambulatory Visit: Payer: Self-pay

## 2024-06-13 ENCOUNTER — Telehealth: Payer: Self-pay | Admitting: Student in an Organized Health Care Education/Training Program

## 2024-06-13 ENCOUNTER — Other Ambulatory Visit (HOSPITAL_COMMUNITY): Payer: Self-pay

## 2024-06-13 DIAGNOSIS — J42 Unspecified chronic bronchitis: Secondary | ICD-10-CM

## 2024-06-13 LAB — URINE CULTURE
MICRO NUMBER:: 16942985
Result:: NO GROWTH
SPECIMEN QUALITY:: ADEQUATE

## 2024-06-13 MED ORDER — STIOLTO RESPIMAT 2.5-2.5 MCG/ACT IN AERS
2.0000 | INHALATION_SPRAY | Freq: Every day | RESPIRATORY_TRACT | 12 refills | Status: DC
  Filled 2024-06-13 (×2): qty 4, 30d supply, fill #0

## 2024-06-13 NOTE — Telephone Encounter (Signed)
 Spoke with pt to let her know the alternative are fully covered when filled through a [facility] pharmacy. She is agreeable to starting and alternative and prefers the medication to be mailed to her from the pharmacy if possible. Would like for the medication to be filled through [facility] pharmacy please.

## 2024-06-13 NOTE — Telephone Encounter (Signed)
 Pt was scheduled for [place] office to see Cobb concerning her Anoro refill. Pt came to [place] office. Pt is established with Dgayli. Unable to do the 4 pm slot. Would like her Anoro or a substitution that [facility] will pay for sent in to pharmacy. Anoro is requiring PA every month and is not available before she runs out. She did want to let Dgayli know she has cut back to 1/2 pack a day5 [place] Progression Recent Vital Signs   There were no vitals taken for this visit.   Past Medical History:  Diagnosis Date   Bronchitis    Hearing impaired      Expected Discharge Date     Diet Order     None        VTE Documentation      Work Intensity Score/Level of Care     @[employer] @   Mobility        Significant Events    DC Barriers   Abnormal Labs:  Natalie Haynes 06/13/2024, 9:25 AM  5 [place] Progression Recent Vital Signs   There were no vitals taken for this visit.   Past Medical History:  Diagnosis Date   Bronchitis    Hearing impaired      Expected Discharge Date     Diet Order     None        VTE Documentation      Work Intensity Score/Level of Care     @[employer]OFCARE @   Mobility        Significant Events    DC Barriers   Abnormal Labs:  Natalie Haynes 06/13/2024, 9:25 AM

## 2024-06-13 NOTE — Telephone Encounter (Signed)
 Spoke with pt to alert her that the prescription has been called in and to give her the pharmacy phone number to confirm delivery options for medication. Pt also had a question about whether or not she needed to come in this month for a 1 hour breathing test on the 26th. I didn't see any further appointments for this in her chart.

## 2024-06-14 ENCOUNTER — Other Ambulatory Visit: Payer: Self-pay

## 2024-06-14 NOTE — Patient Outreach (Signed)
 Complex Care Management   Visit Note  06/14/2024  Name:  Natalie Haynes MRN: [id number] DOB: 1950-10-19  Situation: Referral received for Complex Care Management related to SDOH Barriers:  Housing repairs I obtained verbal consent from Patient.  Visit completed with Patient  on the phone  Background:   Past Medical History:  Diagnosis Date   Bronchitis    Hearing impaired     Assessment:  SW completed a telephone outreach with patient, she states she now has lukewarm water and that is better. She has not been moved to the hotel yet due to her appointments. Patient is connected with her [facility] Case manager Taft as she just spoke with him on 06/13/24.   SDOH Interventions    Flowsheet Row Patient Outreach Telephone from 04/20/2024 in [facility] POPULATION [facility] DEPARTMENT Clinical Support from 05/30/2023 in [facility] [place] Telephone from 05/12/2023 in [facility] [employer] Care Coordination  SDOH Interventions     Food Insecurity Interventions Intervention Not Indicated Intervention Not Indicated Intervention Not Indicated  Housing Interventions Intervention Not Indicated  [does not have the rent this month, but has had it prior] Intervention Not Indicated Intervention Not Indicated  Transportation Interventions Intervention Not Indicated Intervention Not Indicated Intervention Not Indicated  Utilities Interventions Intervention Not Indicated Intervention Not Indicated --  Alcohol Usage Interventions -- Intervention Not Indicated (Score <7) --  Depression Interventions/Treatment  -- PHQ2-9 Score <4 Follow-up Not Indicated --  Financial Strain Interventions -- Intervention Not Indicated --  Physical Activity Interventions -- Intervention Not Indicated --  Stress Interventions -- Intervention Not Indicated --  Social Connections Interventions -- Intervention Not Indicated --  Health Literacy Interventions -- Intervention Not Indicated --     Recommendation:   No recommendations at this time  Follow Up Plan:   Patient has met all care management goals. Care Management case will be closed. Patient has been provided contact information should new needs arise.   Thersia Hoar, HEDWIG, MHA [place]  [facility] Social Worker, Population Health [phone]

## 2024-06-14 NOTE — Patient Instructions (Signed)
 Tailored Plan Medicaid On April 04, 2023 some people on [place] Medicaid will move to a new kind of Medicaid health plan called a Tailored Plan. Tailored Plans cover your doctor visits, prescription drugs, and health care services.    If your [place] Medicaid will move to a Tailored Plan, you should have gotten a letter and welcome packet. If you're not sure, call your [place] Medicaid Enrollment Broker at [phone] and ask.  Check out these free materials, in [place] and [place], to learn more about your Tailored Plan: [email or web address]-Plans/Toolkit  Tailored Care Management Services  TCM services are available to you now. If you are a Tailored Plan member or will be and want information about Tailored Care Management Services including rides to appointments and community and home services, call the Care Management provider for your county of residence:    [facility] ([place], [place])  Member Services: [phone] Behavioral Health Crisis Line: 608-792-8884, [place], [place], [place], [place])  Member Services: [phone] Behavioral Health Crisis Line: 613-871-9895  [facility] [place]) Member Services: [phone] Behavioral Health Crisis Line: [phone]

## 2024-06-20 ENCOUNTER — Other Ambulatory Visit: Payer: Self-pay | Admitting: *Deleted

## 2024-06-20 ENCOUNTER — Other Ambulatory Visit: Payer: Self-pay

## 2024-06-20 DIAGNOSIS — R0602 Shortness of breath: Secondary | ICD-10-CM

## 2024-06-20 NOTE — Patient Instructions (Signed)
 Visit Information  Thank you for taking time to visit with me today. Please don't hesitate to contact me if I can be of assistance to you before our next scheduled appointment.  Your next care management appointment is by telephone on 07/11/24 at 2pm  Please call the care guide team at [phone] if you need to cancel, schedule, or reschedule an appointment.   Please call the Suicide and Crisis Lifeline: 988 call the [place]  National Suicide Prevention Lifeline: (539) 621-6091 or TTY: 937-019-0065 TTY 872 250 4934) to talk to a trained counselor call 1-800-273-TALK (toll free, 24 hour hotline) if you are experiencing a Mental Health or Behavioral Health Crisis or need someone to talk to.  Deajah Erkkila, LCSW [place]  [facility], [facility] Licensed Clinical Social Worker  Direct Dial: [phone]

## 2024-06-20 NOTE — Patient Outreach (Signed)
 Complex Care Management   Visit Note  06/20/2024  Name:  Natalie Haynes MRN: [id number] DOB: 1951-07-18  Situation: Referral received for Complex Care Management related to SDOH Barriers:  Housing instability Lack of essential utilities no AC or hot water , in need of home repair, Depression  Financial Resource Strain I obtained verbal consent from Patient.  Visit completed with patient  on the phone.   Background:   Past Medical History:  Diagnosis Date   Bronchitis    Hearing impaired     Assessment: Patient Reported Symptoms:  Cognitive Cognitive Status: Alert and oriented to person, place, and time, Insightful and able to interpret abstract concepts, Normal speech and language skills Cognitive/Intellectual Conditions Management [RPT]: Not Assessed   Health Maintenance Behaviors: Annual physical exam Healing Pattern: Slow Health Facilitated by: Stress management  Neurological Neurological Review of Symptoms: No symptoms reported    HEENT HEENT Symptoms Reported: No symptoms reported      Cardiovascular Cardiovascular Symptoms Reported: No symptoms reported    Respiratory Respiratory Symptoms Reported: Shortness of breath Other Respiratory Symptoms: Followed by pulmonary next appointment 06/21/24 Additional Respiratory Details: COPD, chronic Bronchities , emphazema Respiratory Management Strategies: Medication therapy  Endocrine Endocrine Symptoms Reported: No symptoms reported    Gastrointestinal Gastrointestinal Symptoms Reported: Unintentional weight loss Additional Gastrointestinal Details: patient reports  83 pounds-recommended that she drink protein drinks-uses 1 box per month Gastrointestinal Management Strategies: Anal sphincter    Genitourinary Genitourinary Symptoms Reported: No symptoms reported    Integumentary Integumentary Symptoms Reported: No symptoms reported    Musculoskeletal Musculoskelatal Symptoms Reviewed: No symptoms reported         Psychosocial Psychosocial Symptoms Reported: Depression - if selected complete PHQ 2-9          06/20/2024    PHQ2-9 Depression Screening   Little interest or pleasure in doing things    Feeling down, depressed, or hopeless    PHQ-2 - Total Score    Trouble falling or staying asleep, or sleeping too much    Feeling tired or having little energy    Poor appetite or overeating     Feeling bad about yourself - or that you are a failure or have let yourself or your family down    Trouble concentrating on things, such as reading the newspaper or watching television    Moving or speaking so slowly that other people could have noticed.  Or the opposite - being so fidgety or restless that you have been moving around a lot more than usual    Thoughts that you would be better off dead, or hurting yourself in some way    PHQ2-9 Total Score    If you checked off any problems, how difficult have these problems made it for you to do your work, take care of things at home, or get along with other people    Depression Interventions/Treatment      There were no vitals filed for this visit.  Medications Reviewed Today     Reviewed by Ermalinda Lenn HERO, LCSW (Social Worker) on 06/20/24 at 1334  Med List Status: <None>   Medication Order Taking? Sig Documenting Provider Last Dose Status Informant  albuterol  (VENTOLIN  HFA) 108 (90 Base) MCG/ACT inhaler [id number]  INHALE 2 PUFFS BY MOUTH EVERY 6 HOURS AS NEEDED FOR WHEEZE OR SHORTNESS OF SHERIDA Isadora Hose, MD  Active Self, Pharmacy Records  docusate sodium  (COLACE) 100 MG capsule [id number]  Take 1 capsule (100 mg total) by mouth 2 (two)  times daily. Dorothyann Drivers, MD  Active   doxycycline  (VIBRA -TABS) 100 MG tablet [id number]  Take 1 tablet (100 mg total) by mouth 2 (two) times daily for 10 days. Dugal, Tabitha, FNP  Active   Tiotropium Bromide-Olodaterol (STIOLTO RESPIMAT ) 2.5-2.5 MCG/ACT AERS [id number]  Inhale 2 puffs into the lungs daily.  Isadora Hose, MD  Active             Recommendation:   PCP Follow-up Pulmonologist 06/21/24 AWV 06/22/24  Follow Up Plan:   Telephone follow up appointment date/time:  07/11/24  Lenn Mean, LCSW [place]  [facility], [facility] Licensed Clinical Social Worker  Direct Dial: [phone]

## 2024-06-20 NOTE — Patient Outreach (Signed)
 SW received a voicemail from patients [facility] worker Taft Quan [phone] asking for a clinical assessment. SW returned Mr. Whitaker's telephone call. He states he is in need of any clinical documentation of patients diagnosis. He states he has tried contacting patients PCP but has not been successful.   Thersia Hoar, HEDWIG, MHA [place]  [facility] Social Worker, Population Health [phone]

## 2024-06-21 ENCOUNTER — Ambulatory Visit (INDEPENDENT_AMBULATORY_CARE_PROVIDER_SITE_OTHER): Admitting: Student in an Organized Health Care Education/Training Program

## 2024-06-21 ENCOUNTER — Ambulatory Visit (INDEPENDENT_AMBULATORY_CARE_PROVIDER_SITE_OTHER): Admitting: Physician Assistant

## 2024-06-21 VITALS — BP 107/70 | HR 68 | Ht 61.5 in | Wt 82.0 lb

## 2024-06-21 DIAGNOSIS — N3941 Urge incontinence: Secondary | ICD-10-CM

## 2024-06-21 DIAGNOSIS — N390 Urinary tract infection, site not specified: Secondary | ICD-10-CM

## 2024-06-21 DIAGNOSIS — R0602 Shortness of breath: Secondary | ICD-10-CM

## 2024-06-21 LAB — PULMONARY FUNCTION TEST ARMC ONLY
DL/VA % pred: 39 %
DL/VA: 1.67 ml/min/mmHg/L
DLCO unc % pred: 36 %
DLCO unc: 6.35 ml/min/mmHg
FEF 25-75 Post: 0.49 L/s
FEF 25-75 Pre: 0.3 L/s
FEF2575-%Change-Post: 62 %
FEF2575-%Pred-Post: 28 %
FEF2575-%Pred-Pre: 17 %
FEV1-%Change-Post: 23 %
FEV1-%Pred-Post: 46 %
FEV1-%Pred-Pre: 37 %
FEV1-Post: 0.93 L
FEV1-Pre: 0.75 L
FEV1FVC-%Change-Post: 2 %
FEV1FVC-%Pred-Pre: 60 %
FEV6-%Change-Post: 21 %
FEV6-%Pred-Post: 77 %
FEV6-%Pred-Pre: 63 %
FEV6-Post: 1.96 L
FEV6-Pre: 1.61 L
FEV6FVC-%Change-Post: 0 %
FEV6FVC-%Pred-Post: 103 %
FEV6FVC-%Pred-Pre: 102 %
FVC-%Change-Post: 20 %
FVC-%Pred-Post: 74 %
FVC-%Pred-Pre: 61 %
FVC-Post: 1.99 L
FVC-Pre: 1.65 L
Post FEV1/FVC ratio: 47 %
Post FEV6/FVC ratio: 98 %
Pre FEV1/FVC ratio: 46 %
Pre FEV6/FVC Ratio: 98 %
RV % pred: 176 %
RV: 3.69 L
TLC % pred: 117 %
TLC: 5.51 L

## 2024-06-21 LAB — URINALYSIS, COMPLETE
Bilirubin, UA: NEGATIVE
Glucose, UA: NEGATIVE
Ketones, UA: NEGATIVE
Leukocytes,UA: NEGATIVE
Nitrite, UA: NEGATIVE
RBC, UA: NEGATIVE
Specific Gravity, UA: 1.03 (ref 1.005–1.030)
Urobilinogen, Ur: 0.2 mg/dL (ref 0.2–1.0)
pH, UA: 6 (ref 5.0–7.5)

## 2024-06-21 LAB — MICROSCOPIC EXAMINATION

## 2024-06-21 MED ORDER — GEMTESA 75 MG PO TABS: 75.0000 mg | ORAL_TABLET | Freq: Every day | ORAL | Status: AC

## 2024-06-21 NOTE — Patient Instructions (Signed)
 Full PFT completed today ? ?

## 2024-06-21 NOTE — Progress Notes (Signed)
 Full PFT completed today ? ?

## 2024-06-21 NOTE — Progress Notes (Signed)
 In and Out Catheterization  Patient is present today for a I & O catheterization due to Cath Urinalysis preferred by provider . Patient was cleaned and prepped in a sterile fashion with betadine . A 14FR cath was inserted no complications were noted , 20ml of urine return was noted, urine was yellow in color. A clean urine sample was collected for Urinalysis. Bladder was drained  And catheter was removed with out difficulty.    Performed by: Beauford Browner, CCMA  Follow up/ Additional notes: 6 week follow-up PVR and Symptom Re-check

## 2024-06-21 NOTE — Progress Notes (Signed)
 06/21/2024 1:20 PM   Eline Geng 07-Jul-1951 [id number]  CC: Chief Complaint  Patient presents with   Establish Care   New Patient (Initial Visit)   Recurrent UTI   HPI: Mariha Sleeper is a 73 y.o. female with PMH COPD who presents today as a new patient for evaluation of recurrent UTI and continuous urinary leakage.   Today she reports chronic urinary urgency and urge incontinence, duration unclear.  She has taken 4 courses of antibiotics, including 3 courses of Cipro , to treat UTI as the source of her urgency/incontinence.  She states her symptoms will improve slightly on antibiotics, but returned to baseline immediately upon completing them.  She denies dysuria or gross hematuria.  She has a remote stone history about 10 years ago.  She has never seen another urologist.  Urine culture history as follows: 06/12/2024: No growth 05/08/2024: Pseudomonas aeruginosa (10k-49k CFU/mL) 04/03/2024: Pseudomonas aeruginosa (50k colonies/mL)  She had a CTAP with contrast on 04/03/2024 that showed no hydronephrosis or urolithiasis.  Notably, there was a 10.6 cm simple appearing cyst in the right hemipelvis.    Notably, she has been struggling with unintentional weight loss recently of unclear etiology.  Dr. Lovetta recommended laparoscopic BSO for her adnexal cyst and GI recommended colonoscopy, however she has these have been deferred in light of her weight loss.  In-office catheterized UA today positive for 1+ protein; urine microscopy with hyaline casts and mucus threads.  Measured residual 20 mL.  PMH: Past Medical History:  Diagnosis Date   Bronchitis    Hearing impaired     Surgical History: Past Surgical History:  Procedure Laterality Date   APPENDECTOMY     CARTILAGE SURGERY     Removed from nose   INNER EAR SURGERY     TUBAL LIGATION      Home Medications:  Allergies as of 06/21/2024       Reactions   Codeine    REACTION: nausea, abd cramps   Ibuprofen     REACTION: antiphylactic shock   Penicillins    REACTION: rash   Remeron  [mirtazapine ] Other (See Comments)   Constipation         Medication List        Accurate as of June 21, 2024  1:20 PM. If you have any questions, ask your nurse or doctor.          albuterol  108 (90 Base) MCG/ACT inhaler Commonly known as: Ventolin  HFA INHALE 2 PUFFS BY MOUTH EVERY 6 HOURS AS NEEDED FOR WHEEZE OR SHORTNESS OF BREATH   docusate sodium  100 MG capsule Commonly known as: Colace Take 1 capsule (100 mg total) by mouth 2 (two) times daily.   doxycycline  100 MG tablet Commonly known as: VIBRA -TABS Take 1 tablet (100 mg total) by mouth 2 (two) times daily for 10 days.   Stiolto Respimat  2.5-2.5 MCG/ACT Aers Generic drug: Tiotropium Bromide-Olodaterol Inhale 2 puffs into the lungs daily.        Allergies:  Allergies  Allergen Reactions   Codeine     REACTION: nausea, abd cramps   Ibuprofen     REACTION: antiphylactic shock   Penicillins     REACTION: rash   Remeron  [Mirtazapine ] Other (See Comments)    Constipation     Family History: Family History  Problem Relation Age of Onset   Miscarriages / Stillbirths Mother    Hearing loss Mother    Drug abuse Mother    Depression Mother    Alcohol abuse Mother  Brain cancer Mother    Mental illness Mother    Early death Father        killed in [place]   Hearing loss Paternal Grandmother    Stroke Paternal Grandmother     Social History:   reports that she has been smoking cigarettes. She has a 100 pack-year smoking history. She has never used smokeless tobacco. She reports that she does not currently use alcohol. She reports that she does not currently use drugs after having used the following drugs: Marijuana and LSD.  Physical Exam: BP 107/70 (BP Location: Left Arm, Patient Position: Sitting, Cuff Size: Small)   Pulse 68   Ht 5' 1.5 (1.562 m)   Wt 82 lb (37.2 kg)   SpO2 99%   BMI 15.24 kg/m   Constitutional:   Alert and oriented, no acute distress, nontoxic appearing HEENT: [place], AT Cardiovascular: No clubbing, cyanosis, or edema Respiratory: Normal respiratory effort, no increased work of breathing Skin: No rashes, bruises or suspicious lesions Neurologic: Grossly intact, no focal deficits, moving all 4 extremities Psychiatric: Normal mood and affect  Laboratory Data: Results for orders placed or performed in visit on 06/21/24  Microscopic Examination   Collection Time: 06/21/24  1:15 PM   Urine  Result Value Ref Range   WBC, UA 0-5 0 - 5 /hpf   RBC, Urine 0-2 0 - 2 /hpf   Epithelial Cells (non renal) 0-10 0 - 10 /hpf   Casts Present (A) None seen /lpf   Cast Type Hyaline casts N/A   Mucus, UA Present (A) Not Estab.   Bacteria, UA Few None seen/Few  Urinalysis, Complete   Collection Time: 06/21/24  1:15 PM  Result Value Ref Range   Specific Gravity, UA 1.030 1.005 - 1.030   pH, UA 6.0 5.0 - 7.5   Color, UA Yellow Yellow   Appearance Ur Clear Clear   Leukocytes,UA Negative Negative   Protein,UA 1+ (A) Negative/Trace   Glucose, UA Negative Negative   Ketones, UA Negative Negative   RBC, UA Negative Negative   Bilirubin, UA Negative Negative   Urobilinogen, Ur 0.2 0.2 - 1.0 mg/dL   Nitrite, UA Negative Negative   Microscopic Examination See below:    Pertinent Imaging: CTAP with contrast, 04/03/2024: CLINICAL DATA:  Acute abdominal pain.  Weakness.  Diarrhea   EXAM: CT ABDOMEN AND PELVIS WITH CONTRAST   TECHNIQUE: Multidetector CT imaging of the abdomen and pelvis was performed using the standard protocol following bolus administration of intravenous contrast.   RADIATION DOSE REDUCTION: This exam was performed according to the departmental dose-optimization program which includes automated exposure control, adjustment of the mA and/or kV according to patient size and/or use of iterative reconstruction technique.   CONTRAST:  60mL OMNIPAQUE  IOHEXOL  300 MG/ML  SOLN    COMPARISON:  None Available.   FINDINGS: Lower chest: Emphysema and bronchial thickening. No basilar airspace disease. Coronary artery calcifications. Small hiatal hernia.   Hepatobiliary: No suspicious liver lesion. Tiny hypodensity in the right hepatic lobe is too small to characterize. Gallbladder physiologically distended, no calcified stone. No biliary dilatation.   Pancreas: 7 mm hypodense lesion in the pancreatic body series 2, image 22. No ductal dilatation or inflammation.   Spleen: Normal in size without focal abnormality.   Adrenals/Urinary Tract: No adrenal nodule. No hydronephrosis. Calcifications at the renal hila are favored to be vascular. No renal inflammation. No suspicious renal lesion. The urinary bladder is decompressed.   Stomach/Bowel: The stomach is nondistended. Small bowel  is decompressed and not well assessed. The appendix is not confidently visualized, there is no evidence of appendicitis. Small volume of stool in the ascending, transverse, and descending colon. Wall thickening versus nondistention of the sigmoid, series 2, image 56. Colonic diverticula without focal diverticulitis.   Vascular/Lymphatic: 3.7 cm infrarenal abdominal aortic aneurysm. Advanced aortic atherosclerosis. There is also circumferential mural thrombus and eccentric plaque in the infrarenal aorta. No periaortic stranding. The portal vein is patent. There are calcified lymph nodes in the porta hepatis.   Reproductive: The uterus is either atrophic or absent. 10.6 cm simple appearing cyst in the pelvis just to the right of midline. No internal complexity by CT.   Other: No ascites.  No evidence of free air.   Musculoskeletal: The bones are under mineralized. No acute osseous finding   IMPRESSION: 1. Wall thickening versus nondistention of the sigmoid colon, can be seen with colitis. 2. Colonic diverticulosis without focal diverticulitis. 3. A 10.6 cm simple appearing  cyst in the pelvis just to the right of midline. Given size this is concerning for low-grade cystic neoplasm. Recommend gynecology consult and consider MRI with IV contrast if clinically warranted. 4. A 7 mm hypodense lesion in the pancreatic body. Recommend follow-up MRI/MRCP in 2 years. 5. A 3.7 cm infrarenal abdominal aortic aneurysm. Recommend surveillance ultrasound in 3 years. Reference: Journal of Vascular Surgery 67.1 (2018): 2-77. J Am Coll Radiol [phone].   Aortic Atherosclerosis (ICD10-I70.0).     Electronically Signed   By: Andrea Gasman M.D.   On: 04/03/2024 17:49  I personally reviewed the images referenced above and note a large left right hemipelvic cyst, no hydronephrosis, no urolithiasis.  Assessment & Plan:   1. Recurrent UTI (Primary) Cath UA is bland today and she is emptying appropriately.  Her 2 recent positive urine cultures grew low colony counts of bacteria, question possible sample contamination.  I think most of her symptoms are due to #2 below, will defer further management of recurrent UTI in favor of managing those symptoms. - Urinalysis, Complete  2. Urge incontinence Chronic urgency and urge incontinence.  I do think her large right hemipelvic cyst could be playing a role in causing mass effect on the bladder and she suspects this as well.  I am giving her Gemtesa  samples today and we will see her back in 6 weeks for symptom recheck. - Vibegron  (GEMTESA ) 75 MG TABS; Take 1 tablet (75 mg total) by mouth daily.   Return in about 6 weeks (around 08/02/2024) for Symptom recheck with PVR.  Lucie Hones, PA-C  [facility] [place] [address] [place], [place] [address] [phone]

## 2024-06-22 ENCOUNTER — Ambulatory Visit

## 2024-06-22 VITALS — BP 102/60 | Ht 61.5 in | Wt 83.2 lb

## 2024-06-22 DIAGNOSIS — Z0000 Encounter for general adult medical examination without abnormal findings: Secondary | ICD-10-CM

## 2024-06-22 DIAGNOSIS — Z0100 Encounter for examination of eyes and vision without abnormal findings: Secondary | ICD-10-CM

## 2024-06-22 NOTE — Patient Instructions (Addendum)
 Ms. Murdy,  Thank you for taking the time for your Medicare Wellness Visit. I appreciate your continued commitment to your health goals. Please review the care plan we discussed, and feel free to reach out if I can assist you further.  Medicare recommends these wellness visits once per year to help you and your care team stay ahead of potential health issues. These visits are designed to focus on prevention, allowing your provider to concentrate on managing your acute and chronic conditions during your regular appointments.  Please note that Annual Wellness Visits do not include a physical exam. Some assessments may be limited, especially if the visit was conducted virtually. If needed, we may recommend a separate in-person follow-up with your provider.  Ongoing Care Seeing your primary care provider every 3 to 6 months helps us  monitor your health and provide consistent, personalized care.   Referrals If a referral was made during today's visit and you haven't received any updates within two weeks, please contact the referred provider directly to check on the status.  Recommended Screenings:  Health Maintenance  Topic Date Due   DTaP/Tdap/Td vaccine (1 - Tdap) Never done   Pneumococcal Vaccine for age over 6 (1 of 2 - PCV) Never done   Breast Cancer Screening  Never done   Colon Cancer Screening  Never done   Zoster (Shingles) Vaccine (1 of 2) Never done   DEXA scan (bone density measurement)  Never done   Screening for Lung Cancer  11/24/2023   COVID-19 Vaccine (1 - 2024-25 season) Never done   Flu Shot  01/01/2025*   Medicare Annual Wellness Visit  06/22/2025   Hepatitis C Screening  Completed   HPV Vaccine  Aged Out   Meningitis B Vaccine  Aged Out  *Topic was postponed. The date shown is not the original due date.       06/22/2024    1:56 PM  Advanced Directives  Does Patient Have a Medical Advance Directive? No   Advance Care Planning is important because it: Ensures  you receive medical care that aligns with your values, goals, and preferences. Provides guidance to your family and loved ones, reducing the emotional burden of decision-making during critical moments.  Vision: Annual vision screenings are recommended for early detection of glaucoma, cataracts, and diabetic retinopathy. These exams can also reveal signs of chronic conditions such as diabetes and high blood pressure.  Dental: Annual dental screenings help detect early signs of oral cancer, gum disease, and other conditions linked to overall health, including heart disease and diabetes.

## 2024-06-22 NOTE — Progress Notes (Signed)
 Subjective:   Natalie Haynes is a 73 y.o. who presents for a Medicare Wellness preventive visit.  As a reminder, Annual Wellness Visits don't include a physical exam, and some assessments may be limited, especially if this visit is performed virtually. We may recommend an in-person follow-up visit with your provider if needed.  Visit Complete: In person  Persons Participating in Visit: Patient.  AWV Questionnaire: No: Patient Medicare AWV questionnaire was not completed prior to this visit.  Cardiac Risk Factors include: advanced age (>3men, >57 women);sedentary lifestyle;smoking/ tobacco exposure     Objective:    Today's Vitals   06/22/24 1328  BP: 102/60  Weight: 83 lb 3.2 oz (37.7 kg)  Height: 5' 1.5 (1.562 m)   Body mass index is 15.47 kg/m.     06/22/2024    1:56 PM 05/02/2024    3:34 PM 04/03/2024   12:30 PM 03/28/2024    3:34 PM 05/30/2023    4:10 PM  Advanced Directives  Does Patient Have a Medical Advance Directive? No No No No No  Would patient like information on creating a medical advance directive?    No - Patient declined No - Patient declined    Current Medications (verified) Outpatient Encounter Medications as of 06/22/2024  Medication Sig   albuterol  (VENTOLIN  HFA) 108 (90 Base) MCG/ACT inhaler INHALE 2 PUFFS BY MOUTH EVERY 6 HOURS AS NEEDED FOR WHEEZE OR SHORTNESS OF BREATH   docusate sodium  (COLACE) 100 MG capsule Take 1 capsule (100 mg total) by mouth 2 (two) times daily.   doxycycline  (VIBRA -TABS) 100 MG tablet Take 1 tablet (100 mg total) by mouth 2 (two) times daily for 10 days.   Tiotropium Bromide-Olodaterol (STIOLTO RESPIMAT ) 2.5-2.5 MCG/ACT AERS Inhale 2 puffs into the lungs daily.   Vibegron  (GEMTESA ) 75 MG TABS Take 1 tablet (75 mg total) by mouth daily.   No facility-administered encounter medications on file as of 06/22/2024.    Allergies (verified) Codeine, Ibuprofen, Penicillins, and Remeron  [mirtazapine ]   History: Past Medical  History:  Diagnosis Date   Bronchitis    Hearing impaired    Past Surgical History:  Procedure Laterality Date   APPENDECTOMY     CARTILAGE SURGERY     Removed from nose   INNER EAR SURGERY     TUBAL LIGATION     Family History  Problem Relation Age of Onset   Miscarriages / Stillbirths Mother    Hearing loss Mother    Drug abuse Mother    Depression Mother    Alcohol abuse Mother    Brain cancer Mother    Mental illness Mother    Early death Father        killed in [place]   Hearing loss Paternal Grandmother    Stroke Paternal Grandmother    Social History   Socioeconomic History   Marital status: Legally Separated    Spouse name: Not on file   Number of children: Not on file   Years of education: Not on file   Highest education level: Not on file  Occupational History   Occupation: retired  Tobacco Use   Smoking status: Every Day    Current packs/day: 2.00    Average packs/day: 2.0 packs/day for 50.0 years (100.0 ttl pk-yrs)    Types: Cigarettes   Smokeless tobacco: Never  Vaping Use   Vaping status: Never Used  Substance and Sexual Activity   Alcohol use: Not Currently   Drug use: Not Currently    Types: Marijuana,  LSD    Comment: in the 70's not since   Sexual activity: Yes    Partners: Male    Birth control/protection: Surgical  Other Topics Concern   Not on file  Social History Narrative   Not on file   Social Drivers of Health   Financial Resource Strain: Low Risk  (06/22/2024)   Overall Financial Resource Strain (CARDIA)    Difficulty of Paying Living Expenses: Not hard at all  Recent Concern: Financial Resource Strain - High Risk (04/18/2024)   Received from [facility]   Overall Financial Resource Strain (CARDIA)    Difficulty of Paying Living Expenses: Hard  Food Insecurity: No Food Insecurity (06/22/2024)   Hunger Vital Sign    Worried About Running Out of Food in the Last Year: Never true    Ran Out of Food in the Last  Year: Never true  Recent Concern: Food Insecurity - Food Insecurity Present (04/18/2024)   Received from [facility]   Hunger Vital Sign    Within the past 12 months, you worried that your food would run out before you got the money to buy more.: Often true    Within the past 12 months, the food you bought just didn't last and you didn't have money to get more.: Sometimes true  Transportation Needs: No Transportation Needs (06/22/2024)   PRAPARE - Administrator, Civil Service (Medical): No    Lack of Transportation (Non-Medical): No  Recent Concern: Transportation Needs - Unmet Transportation Needs (04/18/2024)   Received from [facility] - Transportation    In the past 12 months, has lack of transportation kept you from medical appointments or from getting medications?: Yes    Lack of Transportation (Non-Medical): Yes  Physical Activity: Inactive (06/22/2024)   Exercise Vital Sign    Days of Exercise per Week: 0 days    Minutes of Exercise per Session: 0 min  Stress: No Stress Concern Present (06/22/2024)   [employer] of Occupational Health - Occupational Stress Questionnaire    Feeling of Stress: Not at all  Social Connections: Socially Isolated (06/22/2024)   Social Connection and Isolation Panel    Frequency of Communication with Friends and Family: Three times a week    Frequency of Social Gatherings with Friends and Family: Never    Attends Religious Services: Never    Database administrator or Organizations: No    Attends Engineer, structural: Never    Marital Status: Separated    Tobacco Counseling Ready to quit: Not Answered Counseling given: Not Answered    Clinical Intake:  Pre-visit preparation completed: Yes  Pain : No/denies pain     BMI - recorded: 15.47 Nutritional Status: BMI <19  Underweight Nutritional Risks: Nausea/ vomitting/ diarrhea (nausea today) Diabetes: No  No results  found for: HGBA1C   How often do you need to have someone help you when you read instructions, pamphlets, or other written materials from your doctor or pharmacy?: 1 - Never  Interpreter Needed?: No  Comments: lives alone Information entered by :: B.Majestic Molony,LPN   Activities of Daily Living     06/22/2024    1:56 PM  In your present state of health, do you have any difficulty performing the following activities:  Hearing? 1  Vision? 0  Difficulty concentrating or making decisions? 0  Walking or climbing stairs? 1  Dressing or bathing? 0  Doing errands, shopping? 0  Preparing  Food and eating ? N  Using the Toilet? N  In the past six months, have you accidently leaked urine? Y  Do you have problems with loss of bowel control? N  Managing your Medications? N  Managing your Finances? N  Housekeeping or managing your Housekeeping? N    Patient Care Team: Corwin Antu, FNP as PCP - General (Family Medicine) [place], [place], [place] as [facility] Care Management Delene Thersia PARAS as [facility] Care Management  I have updated your Care Teams any recent Medical Services you may have received from other providers in the past year.     Assessment:   This is a routine wellness examination for Bouton.  Hearing/Vision screen Hearing Screening - Comments:: Patient has no hearing difficulty with hearing aids Vision Screening - Comments:: Pt says their vision is good with glasses Referral to Dr Ashley (pt preference to woman dr)   Mattie Addressed               This Visit's Progress     Patient Stated (pt-stated)   Not on track     06/22/24-I feel that I am doing 100% better this year with my breathing.  I feel good.      VBCI Social Work Care Plan   On track     Problems:   Patient has no hot water, no AC, has difficulty completing her own ADl's and IADL's CSW Clinical Goal(s):   Over the next 90 days the Patient will will follow up with the BSW to assist with possible community  resources to address utility and rental assistance as well as in home care support  Interventions:  Social Determinants of Health in Patient with Chronic Bronchitis : SDOH assessments completed: Depression  , Corporate treasurer , Geophysicist/field seismologist , Housing , Intimate Partner Violence, and Transportation Evaluation of current treatment plan related to unmet needs Confirmed having trouble with applying for food stamps online-agreeable to completing application by mail [facility] card pays for utilities and Youth worker Prime used for ordering protein drinks Referral made to BSW to assist with community resource needs-currently active with BSW and Futures trader from [place] Emotional support continues to be provided related to current life challenges Patient continues to have challenges with no AC, hot water now luke warm, continued need to plunge the toilet with every use, floor needs repair at the front door  continued use of portable AC and fans throughout house Problem Solving strategies explored (discussed reaching out to natural supports and church family for possible assistance, son continues to look into alternative housing options  for patient, Discussed higher level of care options including assisted living placement as well as applying for Arlyss Mosses and [employer] additional housing resources by email Patient agreeable to applying for [employer]  Patient Goals/Self-Care Activities:  Patient to continue to follow up with available community resources to address financial strain  Plan:   Telephone follow up appointment with care management team member scheduled for:  07/11/24       Depression Screen     06/22/2024    1:49 PM 05/08/2024    9:53 AM 04/20/2024    3:52 PM 04/12/2024    9:02 AM 04/03/2024    9:03 AM 05/30/2023    4:13 PM  PHQ 2/9 Scores  PHQ - 2 Score 0 4 5 0  0  PHQ- 9 Score  15 11 0  0  Exception Documentation     Patient refusal  Fall Risk     06/22/2024    1:47 PM 05/08/2024    9:53 AM 05/30/2023    4:12 PM 05/05/2022    9:42 AM  Fall Risk   Falls in the past year? 0 0 0 1  Number falls in past yr: 0 0 0 0  Injury with Fall? 0 0 0 0  Risk for fall due to : No Fall Risks;Impaired balance/gait  No Fall Risks No Fall Risks  Follow up Education provided;Falls prevention discussed  Falls prevention discussed     MEDICARE RISK AT HOME:  Medicare Risk at Home Any stairs in or around the home?: Yes If so, are there any without handrails?: Yes Home free of loose throw rugs in walkways, pet beds, electrical cords, etc?: Yes Adequate lighting in your home to reduce risk of falls?: Yes Life alert?: No Use of a cane, walker or w/c?: Yes Grab bars in the bathroom?: No Shower chair or bench in shower?: Yes Elevated toilet seat or a handicapped toilet?: No  TIMED UP AND GO:  Was the test performed?  Yes  Length of time to ambulate 10 feet: 20 sec Gait slow and steady with assistive device  Cognitive Function: 6CIT completed        06/22/2024    1:59 PM 05/30/2023    4:16 PM  6CIT Screen  What Year? 0 points 0 points  What month? 0 points 0 points  What time? 0 points 0 points  Count back from 20 0 points 0 points  Months in reverse 0 points 0 points  Repeat phrase 0 points 0 points  Total Score 0 points 0 points    Immunizations  There is no immunization history on file for this patient.  Screening Tests Health Maintenance  Topic Date Due   DTaP/Tdap/Td (1 - Tdap) Never done   Pneumococcal Vaccine: 50+ Years (1 of 2 - PCV) Never done   Mammogram  Never done   Colonoscopy  Never done   Zoster Vaccines- Shingrix (1 of 2) Never done   DEXA SCAN  Never done   Lung Cancer Screening  11/24/2023   COVID-19 Vaccine (1 - 2024-25 season) Never done   Influenza Vaccine  01/01/2025 (Originally 05/04/2024)   Medicare Annual Wellness (AWV)  06/22/2025   Hepatitis C Screening  Completed   HPV VACCINES  Aged  Out   Meningococcal B Vaccine  Aged Out    Health Maintenance Items Addressed: Pt declines the Covid vaccine and will obtain Influenza vaccine at her pharmacy or PCP visit.   Additional Screening:  Vision Screening: Recommended annual ophthalmology exams for early detection of glaucoma and other disorders of the eye. Is the patient up to date with their annual eye exam?  No  Who is the provider or what is the name of the office in which the patient attends annual eye exams? Referral to Dr Ashley (preference for woman dr)  Dental Screening: Recommended annual dental exams for proper oral hygiene  Community Resource Referral / Chronic Care Management: CRR required this visit?  No   CCM required this visit?  Appt scheduled with PCP   Plan:    I have personally reviewed and noted the following in the patient's chart:   Medical and social history Use of alcohol, tobacco or illicit drugs  Current medications and supplements including opioid prescriptions. Patient is not currently taking opioid prescriptions. Functional ability and status Nutritional status Physical activity Advanced directives List of other physicians Hospitalizations, surgeries,  and ER visits in previous 12 months Vitals Screenings to include cognitive, depression, and falls Referrals and appointments  In addition, I have reviewed and discussed with patient certain preventive protocols, quality metrics, and best practice recommendations. A written personalized care plan for preventive services as well as general preventive health recommendations were provided to patient.   Erminio LITTIE Saris, LPN   [id number]   After Visit Summary: (MyChart) Due to this being a telephonic visit, the after visit summary with patients personalized plan was offered to patient via MyChart   Notes: Pt expresses her desire to gain weight. Inquires about a better portein shake than Preimer. Pt cannot afford Ensure. Pt relays she has  no hot water or AC, electric heater as she relays her trailer is old and cannot afford to fix. Pt relays she has three SW looking for houising for her (including [place],SW-[facility]). Pt lives alone and has no help. She relays she is SOB many times which hinders what she can accomplish for herself.

## 2024-06-29 ENCOUNTER — Encounter: Payer: Self-pay | Admitting: Nurse Practitioner

## 2024-06-29 ENCOUNTER — Other Ambulatory Visit: Payer: Self-pay | Admitting: Family

## 2024-06-29 ENCOUNTER — Ambulatory Visit: Admitting: Nurse Practitioner

## 2024-06-29 VITALS — BP 100/60 | HR 112 | Temp 98.5°F | Ht 61.0 in | Wt 85.6 lb

## 2024-06-29 DIAGNOSIS — J42 Unspecified chronic bronchitis: Secondary | ICD-10-CM

## 2024-06-29 DIAGNOSIS — J4489 Other specified chronic obstructive pulmonary disease: Secondary | ICD-10-CM

## 2024-06-29 DIAGNOSIS — F17210 Nicotine dependence, cigarettes, uncomplicated: Secondary | ICD-10-CM

## 2024-06-29 DIAGNOSIS — R634 Abnormal weight loss: Secondary | ICD-10-CM | POA: Diagnosis not present

## 2024-06-29 DIAGNOSIS — J449 Chronic obstructive pulmonary disease, unspecified: Secondary | ICD-10-CM

## 2024-06-29 DIAGNOSIS — R0609 Other forms of dyspnea: Secondary | ICD-10-CM

## 2024-06-29 DIAGNOSIS — R110 Nausea: Secondary | ICD-10-CM

## 2024-06-29 MED ORDER — ALBUTEROL SULFATE (2.5 MG/3ML) 0.083% IN NEBU: 2.5000 mg | INHALATION_SOLUTION | Freq: Four times a day (QID) | RESPIRATORY_TRACT | 5 refills | Status: AC | PRN

## 2024-06-29 MED ORDER — BREZTRI AEROSPHERE 160-9-4.8 MCG/ACT IN AERO: 2.0000 | INHALATION_SPRAY | Freq: Two times a day (BID) | RESPIRATORY_TRACT | 11 refills | Status: AC

## 2024-06-29 MED ORDER — VARENICLINE TARTRATE 1 MG PO TABS: 1.0000 mg | ORAL_TABLET | Freq: Two times a day (BID) | ORAL | 5 refills | Status: AC

## 2024-06-29 MED ORDER — VARENICLINE TARTRATE 0.5 MG PO TABS: ORAL_TABLET | ORAL | 0 refills | Status: AC

## 2024-06-29 MED ORDER — BREZTRI AEROSPHERE 160-9-4.8 MCG/ACT IN AERO: 2.0000 | INHALATION_SPRAY | Freq: Two times a day (BID) | RESPIRATORY_TRACT | Status: AC

## 2024-06-29 MED ORDER — GUAIFENESIN ER 600 MG PO TB12: 600.0000 mg | ORAL_TABLET | Freq: Two times a day (BID) | ORAL | 11 refills | Status: AC

## 2024-06-29 NOTE — Progress Notes (Signed)
 @Patient  ID: Natalie Haynes, female    DOB: [birth year], 73 y.o.   MRN: [id number]  Chief Complaint  Patient presents with   Shortness of Breath    SOB/DOE, review PFT  Breathing has been challenging, very SOB with exertion but fine when she sits around.    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 She is a patient of Dr. Clydene and last seen in office 11/11/2022. Past medical history significant for AAA without rupture, pancreatic lesion, ovarian mass, depression without hx of SI.  TEST/EVENTS:  11/23/2022 CT chest: atherosclerosis. Esophagus decompressed. Scarring in apices. Calcified granuloma RUL. Diffuse emphysematous changes.  06/29/2024 PFT: FVC 61, FEV1 37, ratio 47, TLC 117, DLCOcor 91. Severe obstruction with reversibility (23% change)  11/11/2022: OV with Dr. Isadora for pulmonary consult. SOB, cough, sputum production. Likely COPD/bronchitis given smoking hx. Ordered PFT. Refill Anoro. CT chest ordered. Smoking cessation. Provided with Nicotine  lozenges.   06/29/2024: Today - follow up Discussed the use of AI scribe software for clinical note transcription with the patient, who gave verbal consent to proceed.  History of Present Illness Natalie Haynes is a 73 year old female with severe COPD who presents for evaluation of her lung function and management of respiratory symptoms.  She had PFTs with severe COPD and possible asthma overlap. She has a history of allergies, runny nose, and sinus headaches. She is currently using Anoro or Stiolto (unclear which), two puffs in the morning, and a rescue inhaler as needed.  She experiences frequent coughing, particularly when lying down, with thick mucus that is difficult to clear. No wheezing, fever, hemoptysis. She reports a significant weight loss of 40 pounds, but notes that her weight has stabilized recently. She is using  protein drinks to increase her calorie intake, consuming 1-2 drinks a day, each providing 320 calories.  She has a history of smoking and is attempting to quit, currently using patches. No significant cardiovascular history. She does have a hx of depression, which is stable and no hx of SI.   She is supposed to undergo a colonoscopy and an endoscopy with GI but has been postponing this due to lack of help at home afterwards. She also has an ovarian cyst the size of a grapefruit, and has been told by her doctors that she is currently considered too high risk for removal due to her health status. Intermittent abdominal discomfort. No vomiting, changes in bowel habits, bloody stools.     Allergies  Allergen Reactions   Codeine     REACTION: nausea, abd cramps   Ibuprofen     REACTION: antiphylactic shock   Penicillins     REACTION: rash   Remeron  [Mirtazapine ] Other (See Comments)    Constipation      There is no immunization history on file for this patient.  Past Medical History:  Diagnosis Date   Bronchitis    Hearing impaired     Tobacco History: Social History   Tobacco Use  Smoking Status Every Day   Current packs/day: 2.00   Average packs/day: 2.0 packs/day for 50.0 years (100.0 ttl pk-yrs)   Types: Cigarettes  Smokeless Tobacco Never   Ready to quit: Not Answered Counseling given: Not Answered   Outpatient Medications Prior to Visit  Medication Sig Dispense Refill   albuterol  (VENTOLIN  HFA) 108 (90 Base) MCG/ACT inhaler INHALE 2 PUFFS BY MOUTH EVERY 6 HOURS AS NEEDED  FOR WHEEZE OR SHORTNESS OF BREATH 54 each 3   docusate sodium  (COLACE) 100 MG capsule Take 1 capsule (100 mg total) by mouth 2 (two) times daily. 60 capsule 2   omeprazole  (PRILOSEC) 20 MG capsule TAKE 1 CAPSULE BY MOUTH EVERY DAY 90 capsule 1   Tiotropium Bromide-Olodaterol (STIOLTO RESPIMAT ) 2.5-2.5 MCG/ACT AERS Inhale 2 puffs into the lungs daily. 4 g 12   Vibegron  (GEMTESA ) 75 MG TABS Take 1  tablet (75 mg total) by mouth daily.     No facility-administered medications prior to visit.     Review of Systems: as above    Physical Exam:  BP 100/60   Pulse (!) 112 Comment: Going to have an Echo in Oct. Has known heart conditions  Temp 98.5 F (36.9 C)   Ht 5' 1 (1.549 m)   Wt 85 lb 9.6 oz (38.8 kg)   SpO2 92%   BMI 16.17 kg/m   GEN: Pleasant, interactive, well-kempt; in no acute distress HEENT:  Normocephalic and atraumatic. PERRLA. Sclera white. Nasal turbinates pink, moist and patent bilaterally. No rhinorrhea present. Oropharynx pink and moist, without exudate or edema. No lesions, ulcerations, or postnasal drip.  NECK:  Supple w/ fair ROM. No lymphadenopathy.   CV: RRR, no m/r/g, no peripheral edema. Pulses intact, +2 bilaterally. No cyanosis, pallor or clubbing. PULMONARY:  Unlabored, regular breathing. Diminished bilaterally A&P w/o wheezes/rales/rhonchi. No accessory muscle use.  GI: BS present and normoactive. Soft, non-tender to palpation.  MSK: No erythema, warmth or tenderness. Cap refil <2 sec all extrem. Muscle wasting  Neuro: A/Ox3. No focal deficits noted.   Skin: Warm, no lesions or rashe Psych: Normal affect and behavior. Judgement and thought content appropriate.     Lab Results:  CBC    Component Value Date/Time   WBC 4.8 05/02/2024 1534   RBC 4.32 05/02/2024 1534   HGB 14.1 05/02/2024 1534   HGB 14.3 02/26/2014 1343   HCT 43.8 05/02/2024 1534   HCT 43.5 02/26/2014 1343   PLT 418 (H) 05/02/2024 1534   PLT 414 02/26/2014 1343   MCV 101.4 (H) 05/02/2024 1534   MCV 100 02/26/2014 1343   MCH 32.6 05/02/2024 1534   MCHC 32.2 05/02/2024 1534   RDW 14.6 05/02/2024 1534   RDW 13.8 02/26/2014 1343   LYMPHSABS 0.6 (L) 04/12/2024 1015   LYMPHSABS 0.9 (L) 02/26/2014 1343   MONOABS 0.3 04/12/2024 1015   MONOABS 0.4 02/26/2014 1343   EOSABS 0.3 04/12/2024 1015   EOSABS 0.0 02/26/2014 1343   BASOSABS 0.1 04/12/2024 1015   BASOSABS 0.1  02/26/2014 1343    BMET    Component Value Date/Time   NA 142 05/02/2024 1534   NA 140 02/26/2014 1343   K 4.2 05/02/2024 1534   K 3.8 02/26/2014 1343   CL 107 05/02/2024 1534   CL 111 (H) 02/26/2014 1343   CO2 23 05/02/2024 1534   CO2 22 02/26/2014 1343   GLUCOSE 90 05/02/2024 1534   GLUCOSE 86 02/26/2014 1343   BUN 27 (H) 05/02/2024 1534   BUN 26 (H) 02/26/2014 1343   CREATININE 0.64 05/02/2024 1534   CREATININE 0.56 (L) 02/26/2014 1343   CALCIUM 9.6 05/02/2024 1534   CALCIUM 9.4 02/26/2014 1343   GFRNONAA >60 05/02/2024 1534   GFRNONAA >60 02/26/2014 1343   GFRAA >60 02/26/2014 1343    BNP No results found for: BNP   Imaging:  No results found.  Administration History     None  Latest Ref Rng & Units 06/21/2024   11:36 AM  PFT Results  FVC-Pre L 1.65  P  FVC-Predicted Pre % 61  P  FVC-Post L 1.99  P  FVC-Predicted Post % 74  P  Pre FEV1/FVC % % 46  P  Post FEV1/FCV % % 47  P  FEV1-Pre L 0.75  P  FEV1-Predicted Pre % 37  P  FEV1-Post L 0.93  P  DLCO uncorrected ml/min/mmHg 6.35  P  DLCO UNC% % 36  P  DLVA Predicted % 39  P  TLC L 5.51  P  TLC % Predicted % 117  P  RV % Predicted % 176  P    P Preliminary result    No results found for: NITRICOXIDE      Assessment & Plan:   Assessment & Plan Severe chronic obstructive pulmonary disease with asthmatic component Severe COPD with an asthmatic component. Hx of peripheral eosinophilia, likely contributing to type II airway inflammation. Symptoms of chronic bronchitis. Will step up to triple therapy with Breztri  2 puffs bid. Side effect profile reviewed. Advised on oral hygiene following use and potential risk of increased lung infections. Target mucociliary clearance therapies. If high symptom burden persists, consider biologic therapy or addition of inhaler phosphodiesterase inhibitor with Ohtuvayre . Smoking cessation strongly advised. Action plan in place.  - Discontinue  Anoro/Stiolto and start Breztri , two puffs twice a day. - Provide samples of Breztri  and check insurance coverage for the medication. - Start Mucinex  twice a day to help thin sputum. - Order a nebulizer for use as needed  - No exertional hypoxia on walk test   Tobacco use disorder Ongoing tobacco use contributing to worsening COPD. Discussed challenges of quitting smoking and the importance of cessation for maintaining lung function and improving outcomes. - Prescribe Chantix  with a starter pack: 0.5 mg once a day for 3 days, then 0.5 mg twice a day for 4 days, followed by 1 mg twice a day as maintenance. - Safety profile reviewed on Chantix    Unintentional weight loss Significant weight loss of 40 pounds, now stabilized. Possible causes include increased caloric expenditure due to COPD or underlying malignancy. Encouraged to work on high calorie, high protein, small, frequent meals. Will obtain CT chest to assess for any suspicious nodules/masses as she is higher risk for lung malignancy given smoking hx. Advised her to reach out to GI to schedule colonoscopy and EGD. Will need to consider biopsy of ovarian cyst with GYN; higher surgical risk but feel benefit outweighs with the risk with concern for ovarian neoplasm.  - Increase protein drink intake to two drinks per day, providing 320 calories each. - Monitor weight and nutritional status.  Follow-Up CT scan of the chest to be scheduled within the next two weeks. Follow up on CT scan results and adjust management plan accordingly. - Schedule CT scan of the chest within the next two weeks. - Follow up on CT scan results and adjust management plan accordingly.    Advised if symptoms do not improve or worsen, to please contact office for sooner follow up or seek emergency care.   I spent 45 minutes of dedicated to the care of this patient on the date of this encounter to include pre-visit review of records, face-to-face time with the patient  discussing conditions above, post visit ordering of testing, clinical documentation with the electronic health record, making appropriate referrals as documented, and communicating necessary findings to members of the patients care team.  Comer LULLA Rouleau, NP 06/29/2024  Pt aware and understands NP's role.

## 2024-06-29 NOTE — Patient Instructions (Addendum)
 Continue Albuterol  inhaler 2 puffs or 3 mL neb every 6 hours as needed for shortness of breath or wheezing. Notify if symptoms persist despite rescue inhaler/neb use. I've sent in a prescription for nebulizer treatments that you can use twice daily and as needed Stop the Anoro or Stiolto (whichever inhaler you have at home that you use in the morning) and switch to Breztri  2 puffs Twice daily. Brush tongue and rinse mouth afterwards Use guaifenesin  [phone] mg Twice daily for cough/congestion  CT chest scan ordered - someone will contact you to schedule this in next 2 weeks   Labs today to assess kidney function prior to CT scan   Start chantix  - Take 1 (0.5 mg) tablet on days 1-3 then take 1 tablet Twice daily on days 4-7 for starting pack. After this, you will increase to the 1 mg dose twice daily and remain on this for a minimum of 12 weeks after you have quit smoking. If you develop any mood changes, stop and notify immediately. If you develop new chest pain, stop and go to the hospital for further evaluation   Need to follow up with gastroenterology to get your colonoscopy and endoscopy scheduled ASAP   Walk test today without any low oxygen levels on room air, which is good  Follow up in 6 weeks with Dr. Isadora or Izetta Malachy PIETY. If symptoms do not improve or worsen, please contact office for sooner follow up or seek emergency care.

## 2024-06-30 ENCOUNTER — Ambulatory Visit: Payer: Self-pay | Admitting: Student in an Organized Health Care Education/Training Program

## 2024-07-08 ENCOUNTER — Other Ambulatory Visit: Payer: Self-pay | Admitting: Family

## 2024-07-08 DIAGNOSIS — F321 Major depressive disorder, single episode, moderate: Secondary | ICD-10-CM

## 2024-07-11 ENCOUNTER — Other Ambulatory Visit: Payer: Self-pay | Admitting: *Deleted

## 2024-07-11 NOTE — Patient Outreach (Signed)
 Complex Care Management   Visit Note  07/11/2024  Name:  Natalie Haynes MRN: [id number] DOB: [birth year]  Situation: Referral received for Complex Care Management related to SDOH Barriers: Housing instability Lack of essential utilities no AC or hot water , in need of home repair, Depression Financial Resource Strain I obtained verbal consent from Patient. Visit completed with patient on the phone.  Background:   Past Medical History:  Diagnosis Date   Bronchitis    Hearing impaired     Assessment: Patient Reported Symptoms:  Cognitive Cognitive Status: Alert and oriented to person, place, and time, Insightful and able to interpret abstract concepts, Normal speech and language skills Cognitive/Intellectual Conditions Management [RPT]: None reported or documented in medical history or problem list   Health Maintenance Behaviors: Annual physical exam Healing Pattern: Average Health Facilitated by: Stress management, Rest, Healthy diet  Neurological Neurological Review of Symptoms: No symptoms reported    HEENT HEENT Symptoms Reported: No symptoms reported      Cardiovascular Cardiovascular Symptoms Reported: No symptoms reported    Respiratory Respiratory Symptoms Reported: Shortness of breath Other Respiratory Symptoms: Pulmonary appointment completed on 06/21/24-now has a nebulizer Additional Respiratory Details: 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, Bronchitis and emphazema- CT with contrast scheudled for 07/20/24 3pm echo cardiogram  07/20/24 4pm Respiratory Management Strategies: Routine screening Respiratory Self-Management Outcome: 4 (good)  Endocrine Endocrine Symptoms Reported: No symptoms reported Is patient diabetic?: No    Gastrointestinal Gastrointestinal Symptoms Reported: Unintentional weight loss, Incontinence Additional Gastrointestinal Details: Patient now 85 pounds-drinks 2 boost per day and frozen meals from [employer]-only eats microwaveable meals Gastrointestinal  Management Strategies: Incontinence garment/pad Gastrointestinal Comment: colonoscomy and endoscopy needed-    Genitourinary Genitourinary Symptoms Reported: No symptoms reported    Integumentary Integumentary Symptoms Reported: No symptoms reported    Musculoskeletal Musculoskelatal Symptoms Reviewed: Other Other Musculoskeletal Symptoms: has cyst on ovary that needs to be removed-due to weight gain and overall feeling better may need to have that urgery done first-causes pain when laying on right side Musculoskeletal Management Strategies: Adequate rest, Medication therapy      Psychosocial Psychosocial Symptoms Reported: Depression - if selected complete PHQ 2-9 Additional Psychological Details: conitnued grief response due to brother's death and increased financial strain-understand that it takes times-not interested in grief counseling-able to complete ADL's and IADL's-started using cane-no AC in trailer but now as weather cools down, not in need-has space heaters-application Behavioral Management Strategies: Community resources Behaviors When Feeling Stressed/Fearful: trying to stay busy, keeping mind occupied , postive self talk Techniques to Cope with Loss/Stress/Change: Withdraw, Spiritual practice(s) Quality of Family Relationships: supportive, involved, helpful Do you feel physically threatened by others?: No    07/11/2024    PHQ2-9 Depression Screening   Little interest or pleasure in doing things Not at all  Feeling down, depressed, or hopeless Not at all  PHQ-2 - Total Score 0  Trouble falling or staying asleep, or sleeping too much    Feeling tired or having little energy    Poor appetite or overeating     Feeling bad about yourself - or that you are a failure or have let yourself or your family down    Trouble concentrating on things, such as reading the newspaper or watching television    Moving or speaking so slowly that other people could have noticed.  Or the  opposite - being so fidgety or restless that you have been moving around a lot more than usual    Thoughts that you would be  better off dead, or hurting yourself in some way    PHQ2-9 Total Score    If you checked off any problems, how difficult have these problems made it for you to do your work, take care of things at home, or get along with other people    Depression Interventions/Treatment      There were no vitals filed for this visit.  Medications Reviewed Today     Reviewed by Ermalinda Lenn HERO, LCSW (Social Worker) on 07/11/24 at 1403  Med List Status: <None>   Medication Order Taking? Sig Documenting Provider Last Dose Status Informant  albuterol  (PROVENTIL ) (2.5 MG/3ML) 0.083% nebulizer solution [id number] Yes Take 3 mLs (2.5 mg total) by nebulization every 6 (six) hours as needed for wheezing or shortness of breath. Cobb, Comer GAILS, NP  Active   albuterol  (VENTOLIN  HFA) 108 (90 Base) MCG/ACT inhaler [id number] Yes INHALE 2 PUFFS BY MOUTH EVERY 6 HOURS AS NEEDED FOR WHEEZE OR SHORTNESS OF SHERIDA Isadora Hose, MD  Active Self, Pharmacy Records  budesonide-glycopyrrolate-formoterol (BREZTRI  AEROSPHERE) 160-9-4.8 MCG/ACT AERO inhaler [id number] Yes Inhale 2 puffs into the lungs in the morning and at bedtime. Malachy Comer GAILS, NP  Active   budesonide-glycopyrrolate-formoterol (BREZTRI  AEROSPHERE) 160-9-4.8 MCG/ACT AERO inhaler [id number] Yes Inhale 2 puffs into the lungs in the morning and at bedtime. Cobb, Comer GAILS, NP  Active   docusate sodium  (COLACE) 100 MG capsule [id number] Yes Take 1 capsule (100 mg total) by mouth 2 (two) times daily. Dorothyann Drivers, MD  Active   guaiFENesin  (MUCINEX ) 600 MG 12 hr tablet [id number] Yes Take 1 tablet (600 mg total) by mouth 2 (two) times daily. Malachy Comer GAILS, NP  Active   omeprazole  (PRILOSEC) 20 MG capsule [id number] Yes TAKE 1 CAPSULE BY MOUTH EVERY DAY Dugal, Tabitha, FNP  Active   varenicline  (CHANTIX  CONTINUING MONTH PAK) 1 MG tablet  [id number] Yes Take 1 tablet (1 mg total) by mouth 2 (two) times daily. Cobb, Comer GAILS, NP  Active   varenicline  (CHANTIX ) 0.5 MG tablet [id number]  Take 1 tablet on days 1-3 then take 1 tablet Twice daily on days 4-7 for starting pack. After this, you will increase to the 1 mg dose twice daily  Patient not taking: Reported on 07/11/2024   Malachy Comer GAILS, NP  Active   Vibegron  (GEMTESA ) 75 MG TABS [id number] Yes Take 1 tablet (75 mg total) by mouth daily. Vaillancourt, Samantha, PA-C  Active             Recommendation:   PCP Follow-up Specialty provider follow-up as scheduled Patient to continue to follow up with status of food stamp and housing applications   Follow Up Plan:   Closing From:  Complex Care Management-Patient confirms having no additional community resource needs at this time  [employer], [employer] [facility]  [facility], [facility] Licensed Clinical Social Worker  Direct Dial: [phone]

## 2024-07-11 NOTE — Patient Instructions (Signed)
 Visit Information  Thank you for taking time to visit with me today. Please don't hesitate to contact me if I can be of assistance to you before our next scheduled appointment.  Your next care management appointment is no further scheduled appointments.    Closing From: Complex Care Management.  Please call the care guide team at [phone] if you need to cancel, schedule, or reschedule an appointment.   Please call the Suicide and Crisis Lifeline: 988 call the [place]  National Suicide Prevention Lifeline: 408-225-0077 or TTY: 573-226-6272 TTY (226)704-1283) to talk to a trained counselor call 1-800-273-TALK (toll free, 24 hour hotline) call 911 if you are experiencing a Mental Health or Behavioral Health Crisis or need someone to talk to.  Bindi Klomp, LCSW [place]  [facility], [facility] Licensed Clinical Social Worker  Direct Dial: [phone]

## 2024-07-18 ENCOUNTER — Ambulatory Visit
Admission: RE | Admit: 2024-07-18 | Discharge: 2024-07-18 | Disposition: A | Discharge status: Home / Self Care | Source: Ambulatory Visit | Attending: Nurse Practitioner | Admitting: Nurse Practitioner

## 2024-07-18 ENCOUNTER — Encounter: Payer: Self-pay | Admitting: Family

## 2024-07-18 ENCOUNTER — Ambulatory Visit: Attending: Family

## 2024-07-18 DIAGNOSIS — R0609 Other forms of dyspnea: Secondary | ICD-10-CM | POA: Insufficient documentation

## 2024-07-18 DIAGNOSIS — J42 Unspecified chronic bronchitis: Secondary | ICD-10-CM | POA: Diagnosis present

## 2024-07-18 DIAGNOSIS — R634 Abnormal weight loss: Secondary | ICD-10-CM | POA: Diagnosis present

## 2024-07-18 LAB — ECHOCARDIOGRAM COMPLETE
AR max vel: 2.02 cm2
AV Area VTI: 1.73 cm2
AV Area mean vel: 1.96 cm2
AV Mean grad: 3 mmHg
AV Peak grad: 4.8 mmHg
Ao pk vel: 1.09 m/s
S' Lateral: 3.19 cm

## 2024-07-18 MED ORDER — IOHEXOL 300 MG/ML  SOLN
80.0000 mL | Freq: Once | INTRAMUSCULAR | Status: AC | PRN
  Administered 2024-07-18: 80 mL via INTRAVENOUS

## 2024-07-19 ENCOUNTER — Encounter: Payer: Self-pay | Admitting: Nurse Practitioner

## 2024-07-19 NOTE — Telephone Encounter (Signed)
 I assume the patient is calling about her Echocardiogram?  I see that she has an appointment with the cardiologist.  I do not see a referral placed. The echo did show that she has heart failure. This is a chronic and common condition.

## 2024-07-20 NOTE — Progress Notes (Unsigned)
 Cardiology Office Note   Date:  07/23/2024  ID:  Natalie Haynes, Natalie Haynes [birth year], MRN [id number] PCP: Corwin Antu, FNP  [facility] Providers Cardiologist:  Caron Poser, MD     History of Present Illness Natalie Haynes is a 73 y.o. female PMH COPD, tobacco use who presents for further evaluation management of HFrEF, DOE, and AAA.  Patient reports that she has been feeling quite poorly for the last couple of months.  She reports significant DOE and intermittent chest discomfort with exertion.  She denies any current symptoms right now.  She is a longtime smoker and has recently cut back from 2 packs/day to 1 pack/day.  She had a recent echo done which showed moderately reduced LV function.  He recent CT scan also showed severe three-vessel coronary calcium.  Last LDL 96 05/2022.  Relevant CVD History -TTE 07/2024 LVEF 30 to 35% with global hypokinesis, low normal RV function, mild MR - CT chest with contrast 07/2024 severe three-vessel coronary calcifications, aortic atherosclerosis - CT abdomen pelvis 04/2024 3.7 cm infrarenal AAA with circumferential mural thrombus and eccentric plaque   ROS: Pt denies any chest discomfort, jaw pain, arm pain, palpitations, syncope, presyncope, orthopnea, PND, or LE edema.  Studies Reviewed I have independently reviewed the patient's ECG, recent cardiac testing, recent CT scans, recent medical records.  Physical Exam VS:  There were no vitals taken for this visit.       Wt Readings from Last 3 Encounters:  06/29/24 85 lb 9.6 oz (38.8 kg)  06/22/24 83 lb 3.2 oz (37.7 kg)  06/21/24 82 lb (37.2 kg)    GEN: No acute distress. NECK: No JVD; No carotid bruits. CARDIAC: RRR, no murmurs, rubs, gallops. RESPIRATORY:  Clear to auscultation. EXTREMITIES:  Warm and well-perfused. No edema.  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 No symptoms in office today. Recent echocardiogram shows moderately reduced LV function with EF 30 to 35% and global LV dysfunction.  Her recent CT scan shows severe three-vessel coronary artery calcium.  She appears euvolemic on exam today.  This is obviously concerning and represents ischemic cardiomyopathy until proven otherwise.  Plan: - Start ASA 81 mg daily; patient has a reported allergy to ibuprofen, but reports she can tolerate aspirin without issue - Start Crestor 10 mg daily - Start metoprolol XL 25 mg daily - Start losartan 25 mg daily - Will plan for coronary angiography and right heart cath to further workup for new cardiomyopathy. If for some reason this does not show significant CAD, then we will obtain CMR. - Plan to have her come back in 2 weeks to see how she is doing on current medications and add SGLT2 inhibitor and MRA if blood pressure and renal function will tolerate - I advised her to seek out emergency care should she have any worsening or unstable symptoms before that time     Informed Consent   The risks [stroke (1 in 1000), death (1 in 1000), kidney failure [usually temporary] (1 in 500), bleeding (1 in 200), allergic reaction [possibly serious] (1 in 200)], benefits (diagnostic support and management of coronary artery disease) and alternatives of a cardiac catheterization were discussed in detail with Ms. Natalie Haynes and she is willing to proceed.      Aortic atherosclerosis AAA Last LDL 96 05/2022.  She has evidence of aortic atherosclerosis on recent chest CT.  As above, will plan to start ASA and statin.  A CT from 04/2024 showed 3.7 cm infrarenal AAA with circumferential mural thrombus and eccentric plaque.  We will need to perform a surveillance ultrasound ~04/2027.  Tobacco use Ongoing issue.  She has already cut back to 1 pack/day from 2.  She is working with her PCP on this.  I recommend continued cessation  efforts.   Dispo: 2 weeks for ongoing GDMT titration  Signed, Caron Poser, MD

## 2024-07-23 ENCOUNTER — Ambulatory Visit: Payer: Self-pay | Admitting: Nurse Practitioner

## 2024-07-23 ENCOUNTER — Ambulatory Visit

## 2024-07-23 ENCOUNTER — Ambulatory Visit: Payer: Self-pay

## 2024-07-23 ENCOUNTER — Other Ambulatory Visit
Admission: RE | Admit: 2024-07-23 | Discharge: 2024-07-23 | Disposition: A | Discharge status: Home / Self Care | Source: Ambulatory Visit

## 2024-07-23 VITALS — BP 108/62 | HR 88 | Ht 61.5 in | Wt 84.0 lb

## 2024-07-23 DIAGNOSIS — Z79899 Other long term (current) drug therapy: Secondary | ICD-10-CM | POA: Diagnosis present

## 2024-07-23 DIAGNOSIS — I5020 Unspecified systolic (congestive) heart failure: Secondary | ICD-10-CM

## 2024-07-23 DIAGNOSIS — I2510 Atherosclerotic heart disease of native coronary artery without angina pectoris: Secondary | ICD-10-CM | POA: Diagnosis not present

## 2024-07-23 DIAGNOSIS — I7143 Infrarenal abdominal aortic aneurysm, without rupture: Secondary | ICD-10-CM | POA: Diagnosis not present

## 2024-07-23 DIAGNOSIS — I700 Atherosclerosis of aorta: Secondary | ICD-10-CM

## 2024-07-23 DIAGNOSIS — Z720 Tobacco use: Secondary | ICD-10-CM

## 2024-07-23 DIAGNOSIS — I5189 Other ill-defined heart diseases: Secondary | ICD-10-CM | POA: Insufficient documentation

## 2024-07-23 DIAGNOSIS — R931 Abnormal findings on diagnostic imaging of heart and coronary circulation: Secondary | ICD-10-CM | POA: Insufficient documentation

## 2024-07-23 DIAGNOSIS — K219 Gastro-esophageal reflux disease without esophagitis: Secondary | ICD-10-CM

## 2024-07-23 LAB — BASIC METABOLIC PANEL WITH GFR
Anion gap: 14 (ref 5–15)
BUN: 25 mg/dL — ABNORMAL HIGH (ref 8–23)
CO2: 25 mmol/L (ref 22–32)
Calcium: 9 mg/dL (ref 8.9–10.3)
Chloride: 100 mmol/L (ref 98–111)
Creatinine, Ser: 0.43 mg/dL — ABNORMAL LOW (ref 0.44–1.00)
GFR, Estimated: >60 mL/min (ref >60)
Glucose, Bld: 83 mg/dL (ref 70–99)
Potassium: 4.5 mmol/L (ref 3.5–5.1)
Sodium: 139 mmol/L (ref 135–145)

## 2024-07-23 LAB — CBC
HCT: 44.6 % (ref 36.0–46.0)
Hemoglobin: 14.5 g/dL (ref 12.0–15.0)
MCH: 33.3 pg (ref 26.0–34.0)
MCHC: 32.5 g/dL (ref 30.0–36.0)
MCV: 102.5 fL — ABNORMAL HIGH (ref 80.0–100.0)
Platelets: 390 K/uL (ref 150–400)
RBC: 4.35 MIL/uL (ref 3.87–5.11)
RDW: 13.2 % (ref 11.5–15.5)
WBC: 8.4 K/uL (ref 4.0–10.5)
nRBC: 0 % (ref 0.0–0.2)

## 2024-07-23 MED ORDER — OMEPRAZOLE 40 MG PO CPDR
40.0000 mg | DELAYED_RELEASE_CAPSULE | Freq: Every day | ORAL | 5 refills | Status: AC
Start: 2024-07-23 — End: ?

## 2024-07-23 MED ORDER — METOPROLOL SUCCINATE ER 25 MG PO TB24
25.0000 mg | ORAL_TABLET | Freq: Every day | ORAL | 3 refills | Status: AC
Start: 2024-07-23 — End: 2024-10-21

## 2024-07-23 MED ORDER — ROSUVASTATIN CALCIUM 10 MG PO TABS
10.0000 mg | ORAL_TABLET | Freq: Every day | ORAL | 3 refills | Status: AC
Start: 2024-07-23 — End: 2024-10-21

## 2024-07-23 MED ORDER — LOSARTAN POTASSIUM 25 MG PO TABS: 25.0000 mg | ORAL_TABLET | Freq: Every day | ORAL | 3 refills | Status: AC

## 2024-07-23 MED ORDER — ASPIRIN 81 MG PO TBEC: 81.0000 mg | DELAYED_RELEASE_TABLET | Freq: Every day | ORAL | Status: AC

## 2024-07-23 NOTE — Progress Notes (Signed)
 CT chest with stable, chronic changes. Significant emphysema is present. No suspicious nodules/masses. She does have evidence of chronic reflux, which can contribute to coughing. Would recommend increasing omeprazole  to 40 mg daily, take 30 min prior to breakfast in AM - will update rx and reassess at follow up. She does have some prominence of her abdominal aorta but not a definitive diagnosis of aneurysm; should follow with her PCP. Has a slight increase in her right subclavicular lymph node but nonspecifc. Should follow up with PCP and GI for the unexplained weight loss she has had and due to the enlarging lymph node. Thanks.

## 2024-07-23 NOTE — Addendum Note (Signed)
 Addended by: CORWIN ANTU on: 07/23/2024 10:25 AM   Modules accepted: Orders

## 2024-07-23 NOTE — Patient Instructions (Signed)
 Medication Instructions:  Your physician recommends the following medication changes.  START TAKING: Aspirin 81 mg by mouth once daily Rosuvastatin (CRESTOR) 10 mg by mouth once daily Metoprolol Succinate (TOPROL XL) 25 mg by mouth once daily Losartan (Cozaar) 25 mg by mouth once daily  Take all other medications as prescribed. *If you need a refill on your cardiac medications before your next appointment, please call your pharmacy*  Lab Work: Your provider would like for you to have following labs drawn today CBC BMET.   If you have labs (blood work) drawn today and your tests are completely normal, you will receive your results only by: MyChart Message (if you have MyChart) OR A paper copy in the mail If you have any lab test that is abnormal or we need to change your treatment, we will call you to review the results.  Testing/Procedures:  Left and Rigth Heart Catheterization  PT TO CALL AND LET US  KNOW WHAT DATE & TIME TO SCHEDULE.    Follow-Up: At [facility], you and your health needs are our priority.  As part of our continuing mission to provide you with exceptional heart care, our providers are all part of one team.  This team includes your primary Cardiologist (physician) and Advanced Practice Providers or APPs (Physician Assistants and Nurse Practitioners) who all work together to provide you with the care you need, when you need it.  Your next appointment:   2 week(s) post catheterization  Provider:   You may see Caron Poser, MD   We recommend signing up for the patient portal called MyChart.  Sign up information is provided on this After Visit Summary.  MyChart is used to connect with patients for Virtual Visits (Telemedicine).  Patients are able to view lab/test results, encounter notes, upcoming appointments, etc.  Non-urgent messages can be sent to your provider as well.   To learn more about what you can do with MyChart, go to [email or web address].

## 2024-07-24 NOTE — Progress Notes (Signed)
 ATC x1. LVMTCB

## 2024-07-25 NOTE — Progress Notes (Signed)
 ATCx2, LVMTCB. Sending MyChart message as pt has not been able to be reached.

## 2024-07-27 ENCOUNTER — Ambulatory Visit: Payer: Self-pay | Admitting: Nurse Practitioner

## 2024-07-27 NOTE — Progress Notes (Signed)
 Called and spoke to pt - advised of BMET results per Izetta Rouleau, NP. Pt verbalized understanding, NFN

## 2024-07-27 NOTE — Progress Notes (Signed)
BMET unremarkable.

## 2024-07-30 ENCOUNTER — Ambulatory Visit: Admitting: Physician Assistant

## 2024-08-02 ENCOUNTER — Ambulatory Visit: Admitting: Physician Assistant

## 2024-08-13 ENCOUNTER — Encounter: Payer: Self-pay | Admitting: *Deleted

## 2024-08-23 ENCOUNTER — Encounter: Payer: Self-pay | Admitting: Family

## 2024-09-16 ENCOUNTER — Other Ambulatory Visit: Payer: Self-pay | Admitting: Student in an Organized Health Care Education/Training Program

## 2024-09-16 ENCOUNTER — Encounter: Payer: Self-pay | Admitting: Student in an Organized Health Care Education/Training Program

## 2024-09-16 DIAGNOSIS — R0602 Shortness of breath: Secondary | ICD-10-CM

## 2024-11-08 ENCOUNTER — Encounter: Admitting: Family

## 2024-11-09 ENCOUNTER — Ambulatory Visit: Payer: Self-pay | Admitting: Student in an Organized Health Care Education/Training Program

## 2024-11-09 DIAGNOSIS — J449 Chronic obstructive pulmonary disease, unspecified: Secondary | ICD-10-CM

## 2024-11-09 MED ORDER — PREDNISONE 20 MG PO TABS: 20.0000 mg | ORAL_TABLET | Freq: Every day | ORAL | 0 refills | Status: AC

## 2024-11-09 MED ORDER — AZITHROMYCIN 250 MG PO TABS: ORAL_TABLET | ORAL | 0 refills | Status: AC

## 2024-11-09 MED ORDER — PREDNISONE 20 MG PO TABS: 20.0000 mg | ORAL_TABLET | Freq: Every day | ORAL | 0 refills | Status: DC

## 2024-11-09 NOTE — Telephone Encounter (Signed)
 LMTCB. E2C2 please advise when patient calls back.

## 2024-11-09 NOTE — Telephone Encounter (Signed)
 Routing to DR Dgayli to advise

## 2024-11-09 NOTE — Telephone Encounter (Signed)
 Pt reports worsening SOB with exertion, requiring more frequent use of her rescue inhaler. Pt also reports mild dizziness over the last few days. Pt denies having a pulse-oximeter at home. ED advised, pt declined at this time but stated I can't get out of my driveway with this ice. But I'll call 911 if I need to.    FYI Only or Action Required?: FYI only for provider: ED advised.  Patient is followed in Pulmonology for chronic bronchitis, last seen on 06/29/2024 by Malachy Comer GAILS, NP.  Called Nurse Triage reporting Shortness of Breath.  Symptoms began several days ago.  Interventions attempted: Rescue inhaler and Maintenance inhaler.  Symptoms are: gradually worsening.  Triage Disposition: See HCP Within 4 Hours (Or PCP Triage)  Patient/caregiver understands and will follow disposition?: No, refuses disposition     Reason for Triage: Pt only uses inhaler 2x a day but pt feels like she needs to use it more often if possible. Pt stated she just defaults to her rescue inhaler. In the last few days, the pt has been experiencing SOB during the day and during the night. Pt stated any physical activity can cause it, and she uses rescue inhaler to settle down. She stated she's using it more often than she should.   Pt is having a lot of congestion, taking generic cough medicine Geri-Tussin and is out of the med until 11/26/2024. Pt thinks she needs something to losing the phlegm.   Pt sees Dr. Isadora no appt is made, we were in the middle of scheduling a follow up appt.  Reason for Disposition  [1] MILD difficulty breathing (e.g., minimal/no SOB at rest, SOB with walking, pulse < 100) AND [2] NEW-onset or WORSE than normal  Answer Assessment - Initial Assessment Questions 1. RESPIRATORY STATUS: Describe your breathing? (e.g., wheezing, shortness of breath, unable to speak, severe coughing)      I just get to feeling like I feel like I can't get a deep breath, so I revert to in through  the nose, out through the nose, but I'm so congested.   2. ONSET: When did this breathing problem begin?      Last few days  3. PATTERN Does the difficult breathing come and go, or has it been constant since it started?      Come and go, worse with exertion  4. SEVERITY: How bad is your breathing? (e.g., mild, moderate, severe)      Seem to be okay right now.   5. RECURRENT SYMPTOM: Have you had difficulty breathing before? If Yes, ask: When was the last time? and What happened that time?      Yes  6. CARDIAC HISTORY: Do you have any history of heart disease? (e.g., heart attack, angina, bypass surgery, angioplasty)      AAA  7. LUNG HISTORY: Do you have any history of lung disease?  (e.g., pulmonary embolus, asthma, emphysema)     Chronic bronchitis   9. OTHER SYMPTOMS: Do you have any other symptoms? (e.g., chest pain, cough, dizziness, fever, runny nose)     Cough, congestion, dizziness the last few days  10. O2 SATURATION MONITOR:  Do you use an oxygen saturation monitor (pulse oximeter) at home? If Yes, ask: What is your reading (oxygen level) today? What is your usual oxygen saturation reading? (e.g., 95%)       Does not have    Has been using nasal spray and geri-tussin  Protocols used: Breathing Difficulty-A-AH

## 2024-11-12 ENCOUNTER — Ambulatory Visit: Admitting: Student in an Organized Health Care Education/Training Program

## 2024-12-03 ENCOUNTER — Ambulatory Visit: Admitting: Student in an Organized Health Care Education/Training Program

## 2025-05-14 ENCOUNTER — Encounter: Admitting: Family

## 2025-06-26 ENCOUNTER — Ambulatory Visit
